# Patient Record
Sex: Female | Born: 1965
Health system: Southern US, Community
[De-identification: ages and names within clinical notes are randomized; demographics above are authoritative.]

## PROBLEM LIST (undated history)

## (undated) DIAGNOSIS — R112 Nausea with vomiting, unspecified: Secondary | ICD-10-CM

## (undated) DIAGNOSIS — F32A Depression, unspecified: Secondary | ICD-10-CM

## (undated) DIAGNOSIS — M5136 Other intervertebral disc degeneration, lumbar region: Secondary | ICD-10-CM

## (undated) DIAGNOSIS — F329 Major depressive disorder, single episode, unspecified: Secondary | ICD-10-CM

## (undated) DIAGNOSIS — I10 Essential (primary) hypertension: Secondary | ICD-10-CM

## (undated) DIAGNOSIS — F419 Anxiety disorder, unspecified: Secondary | ICD-10-CM

## (undated) DIAGNOSIS — G4700 Insomnia, unspecified: Secondary | ICD-10-CM

## (undated) DIAGNOSIS — M51369 Other intervertebral disc degeneration, lumbar region without mention of lumbar back pain or lower extremity pain: Secondary | ICD-10-CM

## (undated) DIAGNOSIS — E785 Hyperlipidemia, unspecified: Secondary | ICD-10-CM

## (undated) DIAGNOSIS — K589 Irritable bowel syndrome without diarrhea: Secondary | ICD-10-CM

## (undated) DIAGNOSIS — K219 Gastro-esophageal reflux disease without esophagitis: Secondary | ICD-10-CM

## (undated) DIAGNOSIS — Z9889 Other specified postprocedural states: Secondary | ICD-10-CM

## (undated) HISTORY — PX: DILATION AND CURETTAGE OF UTERUS: SHX78

## (undated) HISTORY — PX: ABDOMINAL HYSTERECTOMY: SHX81

## (undated) HISTORY — PX: HEMORROIDECTOMY: SUR656

## (undated) HISTORY — PX: COLONOSCOPY: SHX174

## (undated) HISTORY — PX: APPENDECTOMY: SHX54

---

## 1998-05-20 ENCOUNTER — Other Ambulatory Visit: Admission: RE | Admit: 1998-05-20 | Discharge: 1998-05-20 | Payer: Self-pay | Admitting: Obstetrics and Gynecology

## 2000-11-24 ENCOUNTER — Other Ambulatory Visit: Admission: RE | Admit: 2000-11-24 | Discharge: 2000-11-24 | Payer: Self-pay | Admitting: Obstetrics and Gynecology

## 2004-05-14 ENCOUNTER — Other Ambulatory Visit: Admission: RE | Admit: 2004-05-14 | Discharge: 2004-05-14 | Payer: Self-pay | Admitting: Obstetrics and Gynecology

## 2006-03-24 ENCOUNTER — Other Ambulatory Visit: Admission: RE | Admit: 2006-03-24 | Discharge: 2006-03-24 | Payer: Self-pay | Admitting: Obstetrics and Gynecology

## 2006-04-25 ENCOUNTER — Encounter (INDEPENDENT_AMBULATORY_CARE_PROVIDER_SITE_OTHER): Payer: Self-pay | Admitting: *Deleted

## 2006-04-25 ENCOUNTER — Ambulatory Visit (HOSPITAL_COMMUNITY): Admission: RE | Admit: 2006-04-25 | Discharge: 2006-04-26 | Payer: Self-pay | Admitting: Obstetrics and Gynecology

## 2006-12-16 ENCOUNTER — Observation Stay (HOSPITAL_COMMUNITY): Admission: EM | Admit: 2006-12-16 | Discharge: 2006-12-19 | Payer: Self-pay | Admitting: Emergency Medicine

## 2007-03-21 ENCOUNTER — Encounter: Admission: RE | Admit: 2007-03-21 | Discharge: 2007-03-21 | Payer: Self-pay | Admitting: Gastroenterology

## 2007-04-16 ENCOUNTER — Other Ambulatory Visit: Admission: RE | Admit: 2007-04-16 | Discharge: 2007-04-16 | Payer: Self-pay | Admitting: Obstetrics and Gynecology

## 2007-05-14 ENCOUNTER — Ambulatory Visit (HOSPITAL_COMMUNITY): Admission: RE | Admit: 2007-05-14 | Discharge: 2007-05-14 | Payer: Self-pay | Admitting: *Deleted

## 2007-06-04 ENCOUNTER — Encounter: Admission: RE | Admit: 2007-06-04 | Discharge: 2007-06-04 | Payer: Self-pay | Admitting: Gastroenterology

## 2009-01-28 ENCOUNTER — Ambulatory Visit (HOSPITAL_BASED_OUTPATIENT_CLINIC_OR_DEPARTMENT_OTHER): Admission: RE | Admit: 2009-01-28 | Discharge: 2009-01-28 | Payer: Self-pay | Admitting: General Surgery

## 2009-01-28 ENCOUNTER — Encounter (INDEPENDENT_AMBULATORY_CARE_PROVIDER_SITE_OTHER): Payer: Self-pay | Admitting: General Surgery

## 2010-07-26 ENCOUNTER — Encounter: Admission: RE | Admit: 2010-07-26 | Discharge: 2010-07-26 | Payer: Self-pay | Admitting: Gastroenterology

## 2010-08-08 ENCOUNTER — Observation Stay (HOSPITAL_COMMUNITY): Admission: EM | Admit: 2010-08-08 | Discharge: 2010-08-08 | Payer: Self-pay | Admitting: Emergency Medicine

## 2011-02-22 IMAGING — CR DG NECK SOFT TISSUE
1 series · 1 of 1 positions shown · non-contrast
Comparison: None.

CLINICAL DATA: Sore throat, facial swelling

NECK SOFT TISSUES - 1+ VIEW

[w soft tissue neck]
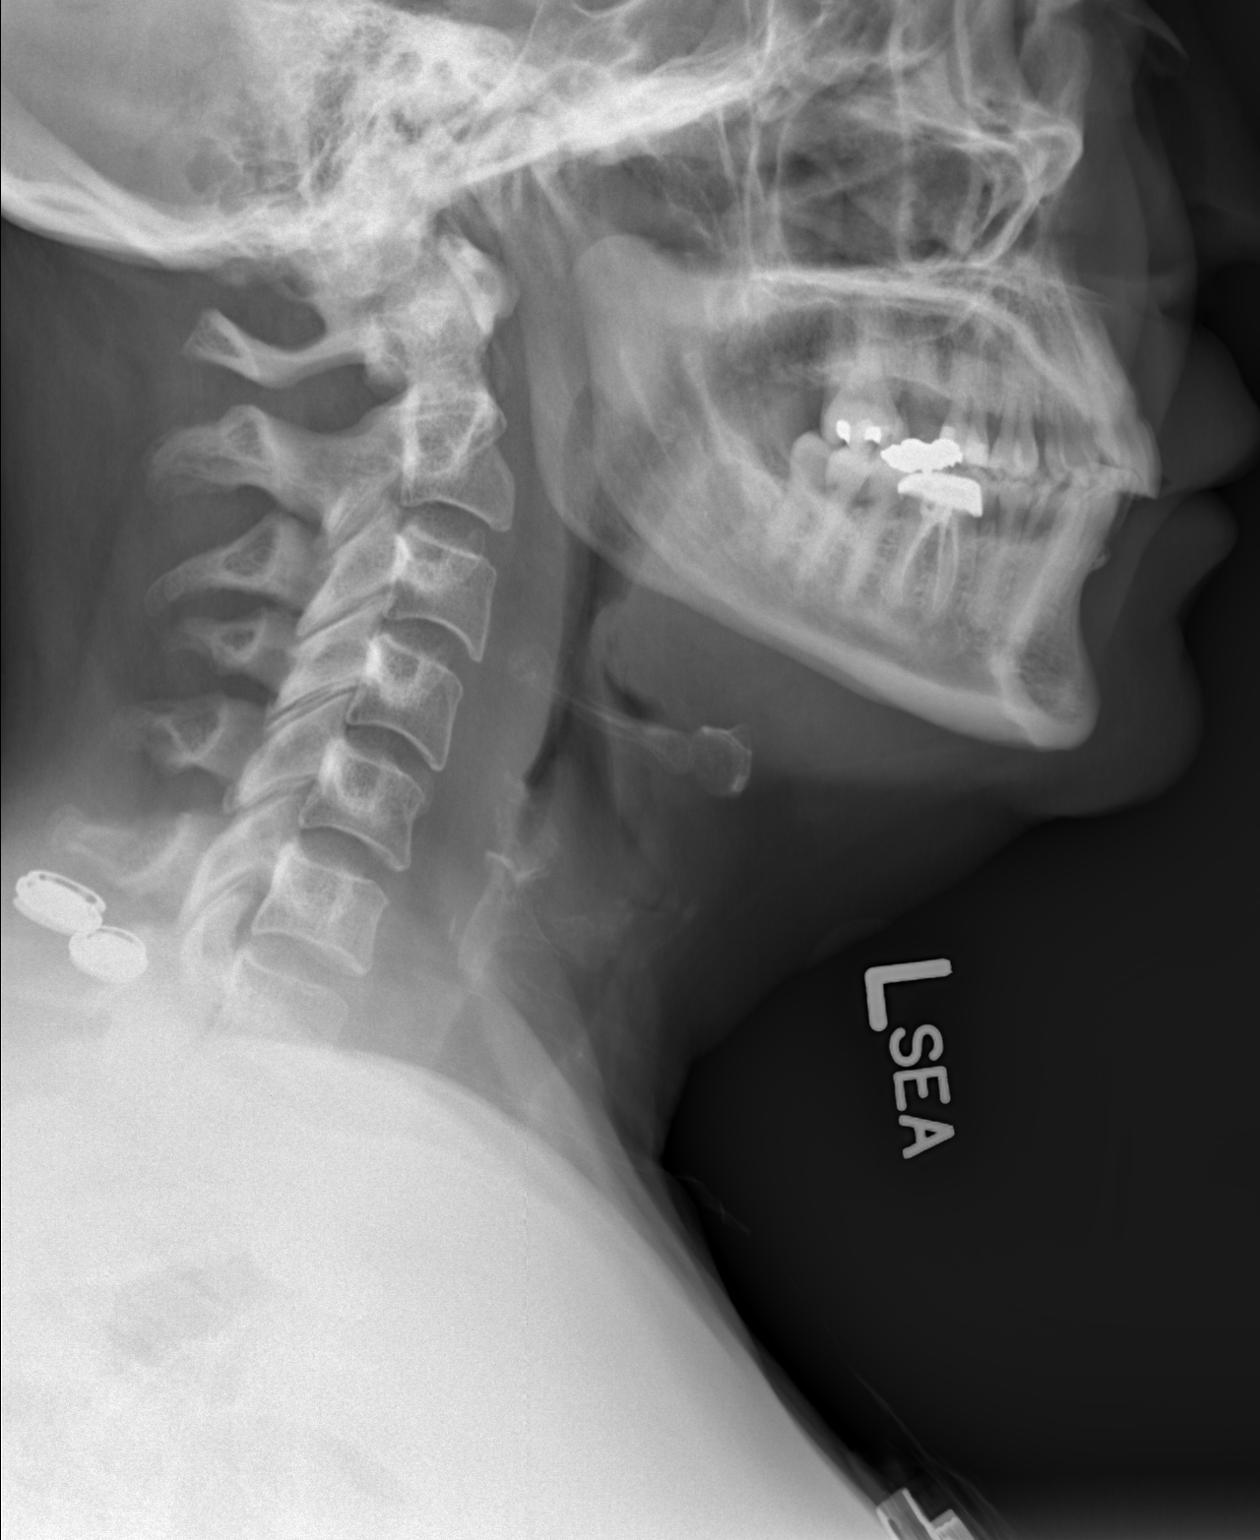

[1 of 1 positions shown; findings below may reference images not displayed]

FINDINGS: Mild thickening of the retropharyngeal soft tissues
measuring 10 mm by plain radiography.  This may related to under
distention of the oropharynx.  Normal appearing epiglottis.  No
retropharyngeal soft tissue air noted.  Cervical spine
unremarkable.
IMPRESSION: Mild retropharyngeal soft tissue thickening by plain radiography.

## 2011-03-22 LAB — DIFFERENTIAL
Basophils Absolute: 0 10*3/uL (ref 0.0–0.1)
Eosinophils Relative: 1 % (ref 0–5)
Lymphs Abs: 2.3 10*3/uL (ref 0.7–4.0)
Monocytes Absolute: 0.6 10*3/uL (ref 0.1–1.0)
Neutrophils Relative %: 70 % (ref 43–77)

## 2011-03-22 LAB — BASIC METABOLIC PANEL
BUN: 15 mg/dL (ref 6–23)
GFR calc Af Amer: >60 mL/min (ref >60)
Glucose, Bld: 97 mg/dL (ref 70–99)
Potassium: 4.3 mEq/L (ref 3.5–5.1)

## 2011-03-22 LAB — CBC
HCT: 38.8 % (ref 36.0–46.0)
Hemoglobin: 13.3 g/dL (ref 12.0–15.0)
RDW: 12.3 % (ref 11.5–15.5)
WBC: 10.2 10*3/uL (ref 4.0–10.5)

## 2011-04-19 NOTE — Op Note (Signed)
NAMECARMEL, Catherine Braun                ACCOUNT NO.:  [id number]   MEDICAL RECORD NO.:  [id number]          PATIENT TYPE:  AMB   LOCATION:  ENDO                         FACILITY:  [facility]   PHYSICIAN:  James L. Malon Kindle., M.D.DATE OF BIRTH:  [birth year]   DATE OF PROCEDURE:  05/14/2007  DATE OF DISCHARGE:  05/14/2007                               OPERATIVE REPORT   PROCEDURE:  Esophageal manometry.   INDICATIONS:  Chest pain of unclear cause with negative cardiac workup,  negative endoscopy, with negative barium swallow showing no stricture or  blockage.  Small hiatal hernia with some reflux.  This is done as an  attempt to determine some other cause to define her continuing pain.   DESCRIPTION OF PROCEDURE:  The procedure was performed the usual manner.  No provocative studies were performed.  The probe was placed and wet  swallows obtained.  The results are as follows:   1. LES:  Seen in the reasonably well although the tracings presented      to be did not show a gross reduction with swallowing.  The computer      analyzed the pressure as 29 mm, which was in the normal range with      an 82% relaxation.  2. Esophageal body:  Normal peristalsis in all wet swallows presented      for evaluation with fairly high pressures in the distal port but      good, normal peristaltic activity.  The mean amplitude was 79 mm      with a duration of 3.7 seconds on the distal esophagus as an      average of all 11 wet swallows.  3. Upper esophageal sphincter was seen fairly well on a couple      swallows and did relax with agglutination.   ASSESSMENT:  Essentially normal esophageal manometry.   PLAN:  Will continue to follow the patient clinically.           ______________________________  Llana Aliment Malon Kindle., M.D.     Waldron Session  D:  05/30/2007  T:  05/30/2007  Job:  [id number]   cc:   Francisca December, M.D.  Malka So, MD  Denese Killings, MD

## 2011-04-19 NOTE — Op Note (Signed)
Catherine Braun, Catherine Braun                ACCOUNT NO.:  [id number]   MEDICAL RECORD NO.:  [id number]          PATIENT TYPE:  AMB   LOCATION:  NESC                         FACILITY:  [facility]   PHYSICIAN:  Anselm Pancoast. Weatherly, M.D.DATE OF BIRTH:  Oct 06, 1966   DATE OF PROCEDURE:  01/28/2009  DATE OF DISCHARGE:                               OPERATIVE REPORT   PREOPERATIVE DIAGNOSIS:  Internal and external hemorrhoids.   POSTOPERATIVE DIAGNOSIS:  Internal and external hemorrhoids.   PROCEDURE:  Internal and external hemorrhoidectomy under general  anesthesia in the lithotomy position.   SURGEON:  Anselm Pancoast. Zachery Dakins, MD.   HISTORY:  Catherine Braun is a 45 year old Caucasian female referred to me  by Dr. Vilinda Boehringer for symptomatic hemorrhoids.  She has irritable bowel  syndrome and has been evaluated by him and is only Amitiza.  She has had  problems with her hemorrhoids, and it appears that her stools are quite  loose, and I saw her in the office approximately 3 or 4 weeks ago and we  discussed whether to try to manage the problems with rubber banding and  kind of office procedures or whether to proceed with a more formal  internal and external hemorrhoidectomy.  She elected to go ahead and  proceed with a formal internal and external hemorrhoidectomy.  In the  office, she has a moderate size hemorrhoid anteriorly with a lot of  redundant tissue.  The largest is on the right lateral, and then the  left lateral is a slightly smaller hemorrhoid.  She is here for the  planned procedure.  She did not take the bottle of Mag Citrate yesterday  afternoon but took it last evening.   She was given 1 g of Ancef and taken to the operative suite and  induction of general anesthesia and endotracheal tube.  The perianal  area was prepped with Betadine surgical scrub and solution, and on  examination you could see the fairly large tags anterior and to the  right side and a smaller one on the left.  I then  after prepping her  with Betadine solution anesthetized the left and right side with 0.25  Marcaine with Adrenaline, and all total about 20 mL was used.  I then  first elevated the anterior hemorrhoid off of the internal sphincter,  removing the external portion, and then under clamping it with a bougie  removed the hemorrhoid and then put some U-stitches of 2-0 chromic first  and then after this I then started at the apex with a continuous 2-0  chromic suture, closing the hemorrhoidal area with a running suture, and  then the external portion I used a couple of interrupted sutures of the  chromic.  Next, the largest hemorrhoid on the right lateral side was  performed.  This hemorrhoid was elevated from the sphincter removing the  fairly large external component and then closing it in the similar  manner with the U-stitches of 2-0 chromic and a running 2-0 chromic.  There was a little external tag between the 2 really on the outside and  I ellipsed that  off and just put some little simple 2-0 chromic sutures  in it.  Next, the one on the left I elevated it off of the sphincter.  There was not a whole lot of external component on that and it was  closed in a similar manner.  All of the suture lines were inspected at  the completion of surgery with no evidence of any active bleeding and  then Nupercaine was placed within the anal canal and an open 4x4 was  used to kind of hopefully hold the medicine in for the next hour or so.  The patient tolerated the procedure, was extubated and said she needed  to void, and then we will make sure she does void before she is  discharged.  I would recommend that we not give her the Amitiza for  approximately 2 to 3 days and use MiraLax if she needs it for a stool  softener and then restart the Amitiza probably about Saturday or Sunday.  I will see her in the office in approximately 7 to 10 days and she will  have Vicodin for pain plus the Xylocaine  ointment.           ______________________________  Anselm Pancoast. Zachery Dakins, M.D.     WJW/MEDQ  D:  01/28/2009  T:  01/28/2009  Job:  [id number]

## 2011-04-22 NOTE — H&P (Signed)
Catherine, Braun NO.:  [id number]   MEDICAL RECORD NO.:  [id number]          PATIENT TYPE:  INP   LOCATION:  4702                         FACILITY:  [facility]   PHYSICIAN:  Della Goo, M.D. DATE OF BIRTH:  1966-08-29   DATE OF ADMISSION:  12/17/2006  DATE OF DISCHARGE:                              HISTORY & PHYSICAL   CHIEF COMPLAINT:  Chest pain   HISTORY OF PRESENT ILLNESS:  This is a 46 year old female who presented  to the emergency department at [facility] with complaints of  substernal chest pain.  She reports having this chest pain  intermittently for 2 days.  The pain is described as being sharp but  also heavy in quality and lasts for minutes each time.  The pain does  radiate into the back.  She denies having any nausea, vomiting,  diarrhea, shortness of breath associated with the symptoms.   PAST MEDICAL HISTORY:  1. Hypertension.  2. Irritable bowel syndrome.   ALLERGIES:  CODEINE.   SOCIAL HISTORY:  Positive tobacco.  Reports being down to half pack per  day.  No alcohol usage.   PAST SURGICAL HISTORY:  1. Total abdominal hysterectomy.  2. Appendectomy.   FAMILY HISTORY:  Father had coronary artery disease and MI at a young  age.   REVIEW OF SYSTEMS:  Negative except for the positives mentioned above  and also positive insomnia.   MEDICATIONS:  1. Hydrochlorothiazide 25 mg one p.o. daily.  2. Effexor XR 150 mg p.o. q.h.s.   PHYSICAL EXAMINATION:  GENERAL:  A 45 year old well-nourished, well-  developed female in no acute distress.  VITAL SIGNS: Temperature 93, blood pressure 157/85, heart rate 78  respirations 16, O2 saturation 99-100%.  HEENT: Normocephalic, atraumatic.  Pupils equally round, reactive to  light.  Extraocular muscles are intact funduscopic benign oropharynx  clear.  NECK: Supple full range of motion of thyromegaly, adenopathy or jugular  venous distension.  CARDIOVASCULAR:  Regular rate and  rhythm.  LUNGS: Clear to auscultation bilaterally.  ABDOMEN:  Positive bowel sounds, soft, nontender, nondistended.  EXTREMITIES:  Without edema.  Neurologic: Alert and oriented.  Nonfocal.  RECTAL/GENITOURINARY:  Deferred   LABORATORY FINDINGS:  Myoglobin 77.7, CK-MB less than 1.0, troponin less  than 0.05.  Sodium 136, potassium 3.1, chloride 104, bicarbonate 28, BUN  15, creatinine 0.9, glucose 109.  Hemoglobin 13.3, hematocrit 39.0, D-  dimer less than 0.22.  EKG findings reveal a normal sinus rhythm and Q-  waves in the inferior leads along with T-wave inversions in the anterior  leads.   ASSESSMENT:  27. A 45 year old female with chest pain being admitted with chest      pain.  Rule out MI.  2. Hypokalemia.  3. Hypertension.  4. Tobacco history.   PLAN:  The patient will be admitted to the telemetry area for cardiac  monitoring.  She will be placed on NitroPaste nasal cannula oxygen.  Beta blocker therapy will be started along with ACE therapy.  The  patient will be placed on Lovenox for DVT prophylaxis.  This will be  changed,  however, if cardiac enzymes begin to return positive.  Smoking  cessation education has also been requested.  The patient will receive  potassium replacement therapy      Della Goo, M.D.  Electronically Signed     HJ/MEDQ  D:  12/18/2006  T:  12/18/2006  Job:  [id number]

## 2011-04-22 NOTE — Op Note (Signed)
NAMESHEREDA, GRAW                ACCOUNT NO.:  [id number]   MEDICAL RECORD NO.:  [id number]          PATIENT TYPE:  INP   LOCATION:  9317                          FACILITY:  [facility]   PHYSICIAN:  Rudy Jew. Ashley Royalty, M.D.DATE OF BIRTH:  02/07/1966   DATE OF PROCEDURE:  04/25/2006  DATE OF DISCHARGE:                                 OPERATIVE REPORT   PREOPERATIVE DIAGNOSES:  1.  Menorrhagia refractory to medical management.  2.  Abnormal uterine bleeding - refractory to medical management.   POSTOPERATIVE DIAGNOSES:  1.  Menorrhagia refractory to medical management.  2.  Abnormal uterine bleeding - refractory to medical management.   PROCEDURE:  Total vaginal hysterectomy, right salpingo-oophorectomy.   SURGEON:  Rudy Jew. Ashley Royalty, MD   ASSISTANT:  Gerald Leitz, MD   ANESTHESIA:  General.   ESTIMATED BLOOD LOSS:  700 mL.   COMPLICATIONS:  None.   PACKS AND DRAINS:  Foley.   COUNTS:  Sponge, needle and instrument count reported as correct x2.   PROCEDURE:  The patient was taken to the operating room and placed in the  dorsal supine position.  After a general anesthetic was administered, she  was placed in the lithotomy position and prepped and draped in the usual  manner for vaginal surgery.  The bladder was drained.  A posterior weighted  retractor was placed per vagina.  Anterior lip of the cervix was grasped  with Christella Hartigan tenaculum.  Approximately 20 mL of 1% Xylocaine with 1:100,000  epinephrine were instilled in the cervix to aid in hemostasis.  The cervix  was then circumscribed with a knife.  A posterior colpotomy incision was  successfully made.  A Steiner-Auvard retractor was placed .  Uterosacral  ligaments were then clamped, cut and secured at #1 chromic catgut.  The  pedicles were held.  The cardinal ligaments were bilaterally clamped, cut  and secured with #1 chromic catgut.  The anterior cul-de-sac was  successfully entered.  The uterine vessels were then  bilaterally clamped,  cut and doubly secured with #1 chromic catgut.  One additional bite along  the parametrium was taken on either side and the pedicle was secured  similarly.  Next, the posterior corpus was delivered with a single-tooth  tenaculum.  The utero-ovarian anastomosis, round ligament and fallopian  tubes were bilaterally clamped, cut and doubly secured #1 chromic catgut and  specimen allowed to deliver.  The specimen was then submitted to Pathology  for histologic studies.  The pedicles were inspected.  A bleeder was  identified proximal to the right adnexal pedicle.  It was difficult to  access with the right adnexa in place.  Hence, the right adnexa was removed  after clamping, cutting and doubly securing the infundibulopelvic ligament.  The bleeding vessel was then better visualized, clamped, cut and secured  with a 2-0 Vicryl interrupted suture.  Hemostasis was noted to be good.  Next, the posterior vaginal cuff was secured with a running-locking suture  from 3 o'clock to 9 o'clock of 2-0 Vicryl.  All pedicles were once again  inspected.  Copious irrigation was accomplished  and hemostasis was noted.  At this point, the patient was felt to have benefited maximally from the  surgical procedure.  The vagina was then closed anteriorly to posteriorly  using interrupted sutures of #1 chromic catgut.  Hemostasis was noted.  At  the conclusion of the procedure, the urine was clear and copious.      James A. Ashley Royalty, M.D.  Electronically Signed     JAM/MEDQ  D:  04/26/2006  T:  04/26/2006  Job:  [id number]

## 2011-04-22 NOTE — Discharge Summary (Signed)
Catherine Braun, Catherine Braun                ACCOUNT NO.:  [id number]   MEDICAL RECORD NO.:  [id number]          PATIENT TYPE:  INP   LOCATION:  4728                         FACILITY:  [facility]   PHYSICIAN:  Beckey Rutter, MD  DATE OF BIRTH:  01/23/1966   DATE OF ADMISSION:  12/16/2006  DATE OF DISCHARGE:                               DISCHARGE SUMMARY   CHIEF COMPLAINT:  Chest pain.   BRIEF HISTORY OF PRESENT ILLNESS:  This is a 45 year old female with  past medical history significant of hypertension and a history of  irritable bowel syndrome came in with chest pain, rule out ACS.  For  full H&P, please refer to the H&P dictated by Dr. Lovell Sheehan on admission  date.   PROCEDURE:  Chest x-ray done on the admission date with the impression  of no evidence of acute cardiopulmonary disease.  Cardiac enzymes,  troponin 0.01 x3.   DISCHARGE DIAGNOSES:  1. Chest pain likely for noncardiogenic reasons.  2. Hypertension.  3. Irritable bowel syndrome.   DISCHARGE MEDICATIONS:  1. Aspirin 325 mg p.o. daily.  2. Metoprolol 25 mg p.o. b.i.d.  3. Lisinopril 10 mg p.o. daily.  4. Effexor 150 mg p.o. daily.  5. Ambien 5 mg p.o. h.s. p.r.n.   HOSPITAL COURSE:  Catherine Braun is a 45 year old female admitted for  chest pain to rule out MI and acute coronary syndrome.  During hospital  stay, patient has been free of chest pain all the time.  No EKG change.  No evidence of MI on the cardiac enzymes.  Patient had a 2-D echo done,  the result is still pending.  Will follow up on that.  The patient was  encouraged to come to collect the result of the 2-D echo.   PLAN:  The plan will be to be discharged, to followup with her primary  physician for further assessment and management.   Note:  Patient was taking hydrochlorothiazide before admission for  hypertension control.  During this hospitalization, this medication was  changed to metoprolol and lisinopril to fit her current suspicions of  coronary  artery disease.  We will discharge the patient on those  medications, to be further followed up and evaluated as needed.      Beckey Rutter, MD  Electronically Signed     EME/MEDQ  D:  12/19/2006  T:  12/20/2006  Job:  [id number]

## 2011-04-22 NOTE — H&P (Signed)
Catherine Braun, Catherine Braun                ACCOUNT NO.:  [id number]   MEDICAL RECORD NO.:  [id number]          PATIENT TYPE:  AMB   LOCATION:  SDC                           FACILITY:  [facility]   PHYSICIAN:  James A. Ashley Royalty, M.D.DATE OF BIRTH:  1966-11-17   DATE OF ADMISSION:  04/25/2006  DATE OF DISCHARGE:                                HISTORY & PHYSICAL   HISTORY:  This is a 45 year old gravida 2, para 2 with a several-year  history of menorrhagia and dysmenorrhea.  Both conditions have been treated  conservatively with hormonal therapy.  The patient recently underwent  ultrasound and sonohysterogram both of which were unremarkable.  She states  a desire for definitive therapy in the form of hysterectomy.   MEDICATION:  Prilosec.   PAST MEDICAL HISTORY:  1.  Medical:      1.  Irritable bowel syndrome.      2.  Variant of multiple sclerosis.      3.  Hiatal hernia.  2.  Surgical:      1.  Status post appendectomy for carcinoid.      2.  Status post D&C hysteroscopy.      3.  Bilateral tubal sterilization procedure.   ALLERGIES:  CODEINE - NAUSEA/VOMITING.  NO RASH.   FAMILY HISTORY:  Noncontributory.   SOCIAL HISTORY:  The patient denies use of tobacco or significant alcohol.   REVIEW OF SYSTEMS:  Noncontributory.   PHYSICAL EXAMINATION:  GENERAL:  Well-developed, well-nourished pleasant  white female no acute distress.  VITAL SIGNS:  Afebrile, vital signs stable.  CHEST:  Lungs are clear.  CARDIAC:  Regular rate and rhythm.  ABDOMEN:  Soft and nontender.  Bowel sounds active.  MUSCULOSKELETAL:  No dependent edema.  PELVIC (PLEASE SEE RECENT OFFICE NOTE):  External genitalia within normal  limits.  Vagina and cervix without gross lesions.  Bimanual examination  revealed uterus to be approximately 8 x 6 x 5 cm and no adnexal masses were  palpable.   IMPRESSION:  1.  Dysmenorrhea and menorrhagia with negative sonohysterogram.  Both are      refractory to medical management.  2.   Irritable bowel syndrome.  3.  Variant of multiple sclerosis.  4.  Hiatal hernia.  5.  Status post appendectomy for carcinoid.   PLAN:  Total vaginal hysterectomy.  Risks, benefits, complications and  alternatives were fully discussed with the patient.  The possibility of  unilateral or bilateral salpingo-oophorectomy discussed and accepted.  The  patient understands in the latter event she would become a candidate for  hormone replacement therapy and agrees to same.  The possibility of total  abdominal hysterectomy discussed and accepted.  Questions invited and  answered.      James A. Ashley Royalty, M.D.  Electronically Signed     JAM/MEDQ  D:  04/24/2006  T:  04/24/2006  Job:  [id number]

## 2011-04-22 NOTE — Discharge Summary (Signed)
NAMECORIANN, Catherine Braun                ACCOUNT NO.:  [id number]   MEDICAL RECORD NO.:  [id number]          PATIENT TYPE:  INP   LOCATION:  9317                          FACILITY:  [facility]   PHYSICIAN:  Rudy Jew. Ashley Royalty, M.D.DATE OF BIRTH:  09/22/1966   DATE OF ADMISSION:  04/25/2006  DATE OF DISCHARGE:  04/26/2006                                 DISCHARGE SUMMARY   DISCHARGE DIAGNOSES:  1.  Dysmenorrhea and menorrhagia refractory to medical management.  2.  Irritable bowel syndrome.  3.  Variant of multiple sclerosis.  4.  Hiatal hernia.  5.  Status post appendectomy for carcinoid.   OPERATIONS/SPECIAL PROCEDURES:  Total vaginal hysterectomy and right  salpingo-oophorectomy.   CONSULTS:  None.   DISCHARGE MEDICATIONS:  1.  Percocet.  2.  Motrin 600 mg.   HISTORY AND PHYSICAL:  This is a 45 year old gravida 2, para 2 with a  several year history of menorrhagia and dysmenorrhea.  Patient had an  ultrasound and sonohysterogram recently both of which were unremarkable.  She has been treated conservatively with hormone therapy without success.  She states a desire for definitive therapy in the form of hysterectomy.  For  the remainder of the history and physical please see chart.   HOSPITAL COURSE:  Patient was admitted to [facility] [place].  Admission laboratory studies were drawn.  She was taken to the operating  room on Apr 25, 2006 and underwent total vaginal hysterectomy along with  right salpingo-oophorectomy.  The procedure was accomplished by Dr. Sylvester Harder without difficulty.  Patient's postoperative course was benign.  She was discharged on Apr 26, 2006 afebrile and in satisfactory condition.   DISPOSITION:  Patient is to return to [facility] in  six weeks for postoperative evaluation.      James A. Ashley Royalty, M.D.  Electronically Signed     JAM/MEDQ  D:  05/25/2006  T:  05/25/2006  Job:  [id number]

## 2012-02-20 ENCOUNTER — Other Ambulatory Visit: Payer: Self-pay | Admitting: Gastroenterology

## 2012-10-31 ENCOUNTER — Other Ambulatory Visit: Payer: Self-pay | Admitting: Orthopedic Surgery

## 2012-11-05 ENCOUNTER — Encounter (HOSPITAL_BASED_OUTPATIENT_CLINIC_OR_DEPARTMENT_OTHER): Payer: Self-pay | Admitting: *Deleted

## 2012-11-05 ENCOUNTER — Encounter (HOSPITAL_BASED_OUTPATIENT_CLINIC_OR_DEPARTMENT_OTHER)
Admission: RE | Admit: 2012-11-05 | Discharge: 2012-11-05 | Disposition: A | Discharge status: Home / Self Care | Payer: BC Managed Care – PPO | Source: Ambulatory Visit | Attending: Orthopedic Surgery | Admitting: Orthopedic Surgery

## 2012-11-05 LAB — BASIC METABOLIC PANEL
BUN: 10 mg/dL (ref 6–23)
Chloride: 101 mEq/L (ref 96–112)
Creatinine, Ser: 0.84 mg/dL (ref 0.50–1.10)
GFR calc Af Amer: >90 mL/min (ref >90)
GFR calc non Af Amer: 81 mL/min — ABNORMAL LOW (ref >90)

## 2012-11-05 NOTE — Progress Notes (Signed)
To come in for bmet-ekg-never seen cardiology-has been taking phentermine

## 2012-11-06 ENCOUNTER — Encounter (HOSPITAL_BASED_OUTPATIENT_CLINIC_OR_DEPARTMENT_OTHER): Payer: Self-pay | Admitting: *Deleted

## 2012-11-06 ENCOUNTER — Encounter (HOSPITAL_BASED_OUTPATIENT_CLINIC_OR_DEPARTMENT_OTHER)
Admission: RE | Disposition: A | Discharge status: Home / Self Care | Payer: Self-pay | Source: Ambulatory Visit | Attending: Orthopedic Surgery

## 2012-11-06 ENCOUNTER — Ambulatory Visit (HOSPITAL_BASED_OUTPATIENT_CLINIC_OR_DEPARTMENT_OTHER)
Admission: RE | Admit: 2012-11-06 | Discharge: 2012-11-06 | Disposition: A | Discharge status: Home / Self Care | Payer: BC Managed Care – PPO | Source: Ambulatory Visit | Attending: Orthopedic Surgery | Admitting: Orthopedic Surgery

## 2012-11-06 ENCOUNTER — Ambulatory Visit (HOSPITAL_BASED_OUTPATIENT_CLINIC_OR_DEPARTMENT_OTHER): Payer: BC Managed Care – PPO | Admitting: *Deleted

## 2012-11-06 DIAGNOSIS — I10 Essential (primary) hypertension: Secondary | ICD-10-CM | POA: Insufficient documentation

## 2012-11-06 DIAGNOSIS — S53409A Unspecified sprain of unspecified elbow, initial encounter: Secondary | ICD-10-CM

## 2012-11-06 DIAGNOSIS — M7710 Lateral epicondylitis, unspecified elbow: Secondary | ICD-10-CM | POA: Insufficient documentation

## 2012-11-06 DIAGNOSIS — S53449A Ulnar collateral ligament sprain of unspecified elbow, initial encounter: Secondary | ICD-10-CM

## 2012-11-06 DIAGNOSIS — Z01812 Encounter for preprocedural laboratory examination: Secondary | ICD-10-CM | POA: Insufficient documentation

## 2012-11-06 HISTORY — DX: Gastro-esophageal reflux disease without esophagitis: K21.9

## 2012-11-06 HISTORY — DX: Irritable bowel syndrome, unspecified: K58.9

## 2012-11-06 HISTORY — DX: Other intervertebral disc degeneration, lumbar region without mention of lumbar back pain or lower extremity pain: M51.369

## 2012-11-06 HISTORY — DX: Insomnia, unspecified: G47.00

## 2012-11-06 HISTORY — DX: Essential (primary) hypertension: I10

## 2012-11-06 HISTORY — DX: Other intervertebral disc degeneration, lumbar region: M51.36

## 2012-11-06 HISTORY — PX: TENDON RECONSTRUCTION: SHX2487

## 2012-11-06 HISTORY — DX: Depression, unspecified: F32.A

## 2012-11-06 HISTORY — DX: Nausea with vomiting, unspecified: R11.2

## 2012-11-06 HISTORY — DX: Hyperlipidemia, unspecified: E78.5

## 2012-11-06 HISTORY — DX: Major depressive disorder, single episode, unspecified: F32.9

## 2012-11-06 HISTORY — DX: Other specified postprocedural states: Z98.890

## 2012-11-06 HISTORY — DX: Anxiety disorder, unspecified: F41.9

## 2012-11-06 LAB — POCT HEMOGLOBIN-HEMACUE: Hemoglobin: 15.2 g/dL — ABNORMAL HIGH (ref 12.0–15.0)

## 2012-11-06 SURGERY — RECONSTRUCTION, TENDON OR LIGAMENT, ELBOW
Anesthesia: Regional | Site: Elbow | Laterality: Left | Wound class: Clean

## 2012-11-06 MED ORDER — POVIDONE-IODINE 7.5 % EX SOLN: Freq: Once | CUTANEOUS | Status: DC

## 2012-11-06 MED ORDER — MIDAZOLAM HCL 2 MG/2ML IJ SOLN
0.5000 mg | INTRAMUSCULAR | Status: DC | PRN
  Administered 2012-11-06: 2 mg via INTRAVENOUS

## 2012-11-06 MED ORDER — DEXAMETHASONE SODIUM PHOSPHATE 4 MG/ML IJ SOLN
INTRAMUSCULAR | Status: DC | PRN
  Administered 2012-11-06: 10 mg via INTRAVENOUS

## 2012-11-06 MED ORDER — SCOPOLAMINE 1 MG/3DAYS TD PT72
1.0000 | MEDICATED_PATCH | TRANSDERMAL | Status: DC
  Administered 2012-11-06: 1.5 mg via TRANSDERMAL

## 2012-11-06 MED ORDER — PROPOFOL 10 MG/ML IV BOLUS
INTRAVENOUS | Status: DC | PRN
  Administered 2012-11-06: 50 mg via INTRAVENOUS
  Administered 2012-11-06: 150 mg via INTRAVENOUS

## 2012-11-06 MED ORDER — OXYCODONE HCL 5 MG PO TABS: 5.0000 mg | ORAL_TABLET | Freq: Once | ORAL | Status: DC | PRN

## 2012-11-06 MED ORDER — SODIUM CHLORIDE 0.9 % IR SOLN
Status: DC | PRN
  Administered 2012-11-06: 1

## 2012-11-06 MED ORDER — ACETAMINOPHEN 10 MG/ML IV SOLN
1000.0000 mg | Freq: Once | INTRAVENOUS | Status: AC
  Administered 2012-11-06: 1000 mg via INTRAVENOUS

## 2012-11-06 MED ORDER — BUPIVACAINE HCL (PF) 0.5 % IJ SOLN
INTRAMUSCULAR | Status: DC | PRN
  Administered 2012-11-06: 10 mL

## 2012-11-06 MED ORDER — BUPIVACAINE-EPINEPHRINE PF 0.5-1:200000 % IJ SOLN
INTRAMUSCULAR | Status: DC | PRN
  Administered 2012-11-06: 30 mL

## 2012-11-06 MED ORDER — FENTANYL CITRATE 0.05 MG/ML IJ SOLN
50.0000 ug | INTRAMUSCULAR | Status: DC | PRN
  Administered 2012-11-06: 100 ug via INTRAVENOUS

## 2012-11-06 MED ORDER — LACTATED RINGERS IV SOLN
INTRAVENOUS | Status: DC
  Administered 2012-11-06 (×2): via INTRAVENOUS

## 2012-11-06 MED ORDER — OXYCODONE HCL 5 MG/5ML PO SOLN: 5.0000 mg | Freq: Once | ORAL | Status: DC | PRN

## 2012-11-06 MED ORDER — ONDANSETRON HCL 4 MG/2ML IJ SOLN
INTRAMUSCULAR | Status: DC | PRN
  Administered 2012-11-06: 4 mg via INTRAVENOUS

## 2012-11-06 MED ORDER — CEFAZOLIN SODIUM-DEXTROSE 2-3 GM-% IV SOLR
2.0000 g | INTRAVENOUS | Status: AC
  Administered 2012-11-06: 2 g via INTRAVENOUS

## 2012-11-06 MED ORDER — HYDROMORPHONE HCL PF 1 MG/ML IJ SOLN: 0.2500 mg | INTRAMUSCULAR | Status: DC | PRN

## 2012-11-06 MED ORDER — OXYCODONE-ACETAMINOPHEN 5-325 MG PO TABS: 1.0000 | ORAL_TABLET | ORAL | Status: DC | PRN

## 2012-11-06 MED ORDER — LIDOCAINE HCL (CARDIAC) 20 MG/ML IV SOLN
INTRAVENOUS | Status: DC | PRN
  Administered 2012-11-06: 20 mg via INTRAVENOUS

## 2012-11-06 SURGICAL SUPPLY — 95 items
ADH SKN CLS APL DERMABOND .7 (GAUZE/BANDAGES/DRESSINGS)
APL SKNCLS STERI-STRIP NONHPOA (GAUZE/BANDAGES/DRESSINGS)
BANDAGE ELASTIC 4 VELCRO ST LF (GAUZE/BANDAGES/DRESSINGS) ×2 IMPLANT
BENZOIN TINCTURE PRP APPL 2/3 (GAUZE/BANDAGES/DRESSINGS) IMPLANT
BIT DRILL 5/64X5 DISP (BIT) ×1 IMPLANT
BLADE 4.2CUDA (BLADE) IMPLANT
BLADE CUDA 5.5 (BLADE) IMPLANT
BLADE CUTTER GATOR 3.5 (BLADE) IMPLANT
BLADE GREAT WHITE 4.2 (BLADE) IMPLANT
BLADE SURG 15 STRL LF DISP TIS (BLADE) IMPLANT
BLADE SURG 15 STRL SS (BLADE) ×4
BLADE SURG ROTATE 9660 (MISCELLANEOUS) IMPLANT
BLADE VORTEX 6.0 (BLADE) IMPLANT
BUR 3.5 LG SPHERICAL (BURR) IMPLANT
BUR FULL RADIUS 2.9 (BURR) IMPLANT
BUR OVAL 4.0 (BURR) ×2 IMPLANT
BUR OVAL 6.0 (BURR) IMPLANT
BURR 3.5 LG SPHERICAL (BURR)
CANISTER OMNI JUG 16 LITER (MISCELLANEOUS) ×1 IMPLANT
CANISTER SUCTION 2500CC (MISCELLANEOUS) IMPLANT
CHLORAPREP W/TINT 26ML (MISCELLANEOUS) ×2 IMPLANT
CLOTH BEACON ORANGE TIMEOUT ST (SAFETY) ×2 IMPLANT
CUFF TOURNIQUET SINGLE 18IN (TOURNIQUET CUFF) ×1 IMPLANT
DECANTER SPIKE VIAL GLASS SM (MISCELLANEOUS) IMPLANT
DERMABOND ADVANCED (GAUZE/BANDAGES/DRESSINGS)
DERMABOND ADVANCED .7 DNX12 (GAUZE/BANDAGES/DRESSINGS) IMPLANT
DRAPE ARTHROSCOPY W/POUCH 90 (DRAPES) ×3 IMPLANT
DRAPE INCISE IOBAN 66X45 STRL (DRAPES) ×2 IMPLANT
DRAPE STERI 35X30 U-POUCH (DRAPES) ×1 IMPLANT
DRAPE SURG 17X23 STRL (DRAPES) ×2 IMPLANT
DRAPE U 20/CS (DRAPES) ×2 IMPLANT
DRAPE U-SHAPE 47X51 STRL (DRAPES) ×2 IMPLANT
DRSG PAD ABDOMINAL 8X10 ST (GAUZE/BANDAGES/DRESSINGS) ×2 IMPLANT
ELECT REM PT RETURN 9FT ADLT (ELECTROSURGICAL) ×2
ELECTRODE REM PT RTRN 9FT ADLT (ELECTROSURGICAL) ×1 IMPLANT
FIBERLOOP 2 0 (SUTURE) ×1 IMPLANT
GAUZE SPONGE 4X4 12PLY STRL LF (GAUZE/BANDAGES/DRESSINGS) ×1 IMPLANT
GAUZE SPONGE 4X4 16PLY XRAY LF (GAUZE/BANDAGES/DRESSINGS) IMPLANT
GAUZE XEROFORM 1X8 LF (GAUZE/BANDAGES/DRESSINGS) ×1 IMPLANT
GLOVE BIO SURGEON STRL SZ7 (GLOVE) ×2 IMPLANT
GLOVE BIO SURGEON STRL SZ7.5 (GLOVE) ×4 IMPLANT
GLOVE BIOGEL PI IND STRL 7.0 (GLOVE) ×1 IMPLANT
GLOVE BIOGEL PI IND STRL 8 (GLOVE) ×2 IMPLANT
GLOVE BIOGEL PI INDICATOR 7.0 (GLOVE) ×1
GLOVE BIOGEL PI INDICATOR 8 (GLOVE) ×2
GOWN PREVENTION PLUS XLARGE (GOWN DISPOSABLE) ×3 IMPLANT
GRAFT TISS 230-320 GRACILIS (Bone Implant) IMPLANT
NS IRRIG 1000ML POUR BTL (IV SOLUTION) ×2 IMPLANT
PACK ARTHROSCOPY DSU (CUSTOM PROCEDURE TRAY) ×2 IMPLANT
PACK BASIN DAY SURGERY FS (CUSTOM PROCEDURE TRAY) ×2 IMPLANT
PAD CAST 4YDX4 CTTN HI CHSV (CAST SUPPLIES) IMPLANT
PADDING CAST COTTON 4X4 STRL (CAST SUPPLIES) ×4
PASSER SUT SWANSON 36MM LOOP (INSTRUMENTS) ×1 IMPLANT
PENCIL BUTTON HOLSTER BLD 10FT (ELECTRODE) ×1 IMPLANT
RESECTOR FULL RADIUS 4.2MM (BLADE) ×1 IMPLANT
RESECTOR FULL RADIUS 4.8MM (BLADE) IMPLANT
SHEET MEDIUM DRAPE 40X70 STRL (DRAPES) IMPLANT
SLEEVE SCD COMPRESS KNEE MED (MISCELLANEOUS) ×2 IMPLANT
SLING ARM FOAM STRAP LRG (SOFTGOODS) ×2 IMPLANT
SLING ARM FOAM STRAP MED (SOFTGOODS) IMPLANT
SLING ARM FOAM STRAP XLG (SOFTGOODS) IMPLANT
SLING ARM IMMOBILIZER MED (SOFTGOODS) IMPLANT
SPLINT FAST PLASTER 5X30 (CAST SUPPLIES) ×10
SPLINT PLASTER CAST FAST 5X30 (CAST SUPPLIES) IMPLANT
SPONGE GAUZE 4X4 12PLY (GAUZE/BANDAGES/DRESSINGS) ×2 IMPLANT
SPONGE LAP 4X18 X RAY DECT (DISPOSABLE) ×1 IMPLANT
STRIP CLOSURE SKIN 1/2X4 (GAUZE/BANDAGES/DRESSINGS) ×1 IMPLANT
SUCTION FRAZIER TIP 10 FR DISP (SUCTIONS) ×1 IMPLANT
SUT 2 FIBERLOOP 20 STRT BLUE (SUTURE) ×4
SUT BONE WAX W31G (SUTURE) IMPLANT
SUT ETHILON 3 0 PS 1 (SUTURE) ×1 IMPLANT
SUT ETHILON 4 0 PS 2 18 (SUTURE) IMPLANT
SUT FIBERWIRE #2 38 T-5 BLUE (SUTURE) ×2
SUT MNCRL AB 3-0 PS2 18 (SUTURE) IMPLANT
SUT MNCRL AB 4-0 PS2 18 (SUTURE) ×1 IMPLANT
SUT PROLENE 3 0 PS 2 (SUTURE) IMPLANT
SUT VIC AB 0 CT1 18XCR BRD 8 (SUTURE) IMPLANT
SUT VIC AB 0 CT1 27 (SUTURE) ×2
SUT VIC AB 0 CT1 27XBRD ANBCTR (SUTURE) IMPLANT
SUT VIC AB 0 CT1 8-18 (SUTURE) ×2
SUT VIC AB 2-0 SH 18 (SUTURE) IMPLANT
SUT VIC AB 2-0 SH 27 (SUTURE) ×2
SUT VIC AB 2-0 SH 27XBRD (SUTURE) IMPLANT
SUTURE 2 FIBERLOOP 20 STRT BLU (SUTURE) IMPLANT
SUTURE FIBERWR #2 38 T-5 BLUE (SUTURE) IMPLANT
SYR BULB 3OZ (MISCELLANEOUS) ×1 IMPLANT
TENDON GRACILIS FROZEN (Bone Implant) ×2 IMPLANT
TENDON GRACILIS FROZEN 230-320 (Bone Implant) ×1 IMPLANT
TOWEL OR 17X24 6PK STRL BLUE (TOWEL DISPOSABLE) ×2 IMPLANT
TOWEL OR NON WOVEN STRL DISP B (DISPOSABLE) ×2 IMPLANT
TUBE CONNECTING 20X1/4 (TUBING) ×2 IMPLANT
TUBING ARTHROSCOPY IRRIG 16FT (MISCELLANEOUS) ×2 IMPLANT
WAND STAR VAC 90 (SURGICAL WAND) ×1 IMPLANT
WATER STERILE IRR 1000ML POUR (IV SOLUTION) ×2 IMPLANT
YANKAUER SUCT BULB TIP NO VENT (SUCTIONS) ×1 IMPLANT

## 2012-11-06 NOTE — H&P (Signed)
Catherine Braun is an 46 y.o. female.   Chief Complaint: L elbow pain HPI: Chronic L elbow lateral epicondylitis with underlying collateral ligament disruption.  Failed conservative treatment.  Past Medical History  Diagnosis Date  . Hypertension   . Anxiety   . Depression   . DDD (degenerative disc disease), lumbar   . IBS (irritable bowel syndrome)   . GERD (gastroesophageal reflux disease)   . Hyperlipemia   . Insomnia   . PONV (postoperative nausea and vomiting)     used a scop patch last surgery-helped    Past Surgical History  Procedure Date  . Appendectomy   . Abdominal hysterectomy   . Dilation and curettage of uterus   . Hemorroidectomy   . Colonoscopy     History reviewed. No pertinent family history. Social History:  reports that she quit smoking about 6 years ago. She does not have any smokeless tobacco history on file. She reports that she drinks alcohol. She reports that she does not use illicit drugs.  Allergies:  Allergies  Allergen Reactions  . Lisinopril Anaphylaxis  . Codeine Nausea And Vomiting    Medications Prior to Admission  Medication Sig Dispense Refill  . ALPRAZolam (XANAX) 0.5 MG tablet Take 0.5 mg by mouth at bedtime as needed.      Marland Kitchen amLODipine (NORVASC) 5 MG tablet Take 5 mg by mouth daily.      Marland Kitchen estradiol (ESTRACE) 2 MG tablet Take 2 mg by mouth daily.      . metoprolol tartrate (LOPRESSOR) 25 MG tablet Take 25 mg by mouth 2 (two) times daily.      Marland Kitchen omeprazole (PRILOSEC) 20 MG capsule Take 20 mg by mouth daily.      . phentermine 37.5 MG capsule Take 37.5 mg by mouth every morning.      . pravastatin (PRAVACHOL) 20 MG tablet Take 20 mg by mouth every evening.      . venlafaxine XR (EFFEXOR-XR) 150 MG 24 hr capsule Take 150 mg by mouth daily.      Marland Kitchen zolpidem (AMBIEN) 10 MG tablet Take 10 mg by mouth at bedtime as needed.        Results for orders placed during the hospital encounter of 11/06/12 (from the past 48 hour(s))  BASIC  METABOLIC PANEL     Status: Abnormal   Collection Time   11/05/12  5:05 PM      Component Value Range Comment   Sodium 138  135 - 145 mEq/L    Potassium 3.5  3.5 - 5.1 mEq/L    Chloride 101  96 - 112 mEq/L    CO2 24  19 - 32 mEq/L    Glucose, Bld 203 (*) 70 - 99 mg/dL    BUN 10  6 - 23 mg/dL    Creatinine, Ser 9.81  0.50 - 1.10 mg/dL    Calcium 9.2  8.4 - 19.1 mg/dL    GFR calc non Af Amer 81 (*) >90 mL/min    GFR calc Af Amer >90  >90 mL/min    No results found.  Review of Systems  All other systems reviewed and are negative.    Blood pressure 152/83, pulse 60, temperature 97.7 F (36.5 C), temperature source Oral, resp. rate 16, height 5\' 2"  (1.575 m), weight 75.807 kg (167 lb 2 oz), SpO2 98.00%. Physical Exam  Constitutional: She is oriented to person, place, and time. She appears well-developed and well-nourished.  HENT:  Head: Atraumatic.  Eyes: EOM  are normal.  Cardiovascular: Intact distal pulses.   Respiratory: Effort normal.  Musculoskeletal:       Left elbow: tenderness found. Lateral epicondyle tenderness noted.  Neurological: She is alert and oriented to person, place, and time.  Skin: Skin is warm and dry.  Psychiatric: She has a normal mood and affect.     Assessment/Plan L chronic lateral epicondylitis with underlying collateral ligament insufficiency Plan lateral collateral ligment reconstruction with debridment lateral epicondyle. Risks / benefits of surgery discussed Consent on chart  NPO for OR Preop antibiotics   Tamasha Laplante WILLIAM 11/06/2012, 10:32 AM

## 2012-11-06 NOTE — Transfer of Care (Signed)
Immediate Anesthesia Transfer of Care Note  Patient: Catherine Braun  Procedure(s) Performed: Procedure(s) (LRB) with comments: ELBOW TENDON RECONSTRUCTION (Left) - Left elbow radial collateral ligament reconstruction   Patient Location: PACU  Anesthesia Type:GA combined with regional for post-op pain  Level of Consciousness: awake, alert  and oriented  Airway & Oxygen Therapy: Patient Spontanous Breathing and Patient connected to face mask oxygen  Post-op Assessment: Report given to PACU RN, Post -op Vital signs reviewed and stable and Patient moving all extremities  Post vital signs: Reviewed and stable  Complications: No apparent anesthesia complications

## 2012-11-06 NOTE — Anesthesia Procedure Notes (Addendum)
Anesthesia Regional Block:  Supraclavicular block  Pre-Anesthetic Checklist: ,, timeout performed, Correct Patient, Correct Site, Correct Laterality, Correct Procedure, Correct Position, site marked, Risks and benefits discussed, pre-op evaluation, post-op pain management  Laterality: Left  Prep: Maximum Sterile Barrier Precautions used and chloraprep       Needles:  Injection technique: Single-shot  Needle Type: Echogenic Stimulator Needle      Needle Gauge: 22 and 22 G    Additional Needles:  Procedures: ultrasound guided (picture in chart) Supraclavicular block Narrative:  Start time: 11/06/2012 10:16 AM End time: 11/06/2012 10:25 AM Injection made incrementally with aspirations every 5 mL. Anesthesiologist: Fitzgerald,MD  Additional Notes: 2% Lidocaine skin wheel. Intercostobrachial block with 5cc of 0.5% Bupivicaine plain.  Supraclavicular block Procedure Name: LMA Insertion Date/Time: 11/06/2012 10:54 AM Performed by: Suann Larry WOLFE Pre-anesthesia Checklist: Patient identified, Emergency Drugs available, Suction available and Patient being monitored Patient Re-evaluated:Patient Re-evaluated prior to inductionOxygen Delivery Method: Circle System Utilized Preoxygenation: Pre-oxygenation with 100% oxygen Intubation Type: IV induction Ventilation: Mask ventilation without difficulty LMA: LMA inserted LMA Size: 4.0 Number of attempts: 1 Airway Equipment and Method: bite block Placement Confirmation: positive ETCO2 and breath sounds checked- equal and bilateral Tube secured with: Tape Dental Injury: Teeth and Oropharynx as per pre-operative assessment

## 2012-11-06 NOTE — Anesthesia Postprocedure Evaluation (Signed)
  Anesthesia Post-op Note  Patient: Catherine Braun  Procedure(s) Performed: Procedure(s) (LRB) with comments: ELBOW TENDON RECONSTRUCTION (Left) - Left elbow radial collateral ligament reconstruction   Patient Location: PACU  Anesthesia Type:GA combined with regional for post-op pain  Level of Consciousness: awake and alert   Airway and Oxygen Therapy: Patient Spontanous Breathing  Post-op Pain: none  Post-op Assessment: Post-op Vital signs reviewed, Patient's Cardiovascular Status Stable, Respiratory Function Stable, Patent Airway and No signs of Nausea or vomiting  Post-op Vital Signs: Reviewed and stable  Complications: No apparent anesthesia complications

## 2012-11-06 NOTE — Anesthesia Preprocedure Evaluation (Addendum)
Anesthesia Evaluation  Patient identified by MRN, date of birth, ID band Patient awake    Reviewed: Allergy & Precautions, H&P , NPO status , Patient's Chart, lab work & pertinent test results, reviewed documented beta blocker date and time   History of Anesthesia Complications (+) PONV  Airway Mallampati: I TM Distance: >3 FB Neck ROM: Full    Dental No notable dental hx. (+) Teeth Intact and Dental Advisory Given   Pulmonary neg pulmonary ROS,  breath sounds clear to auscultation  Pulmonary exam normal       Cardiovascular hypertension, On Medications and On Home Beta Blockers Rhythm:Regular Rate:Normal     Neuro/Psych PSYCHIATRIC DISORDERS negative neurological ROS     GI/Hepatic Neg liver ROS, GERD-  Medicated and Controlled,  Endo/Other  negative endocrine ROS  Renal/GU negative Renal ROS  negative genitourinary   Musculoskeletal   Abdominal   Peds  Hematology negative hematology ROS (+)   Anesthesia Other Findings   Reproductive/Obstetrics negative OB ROS                           Anesthesia Physical Anesthesia Plan  ASA: II  Anesthesia Plan: General and Regional   Post-op Pain Management:    Induction: Intravenous  Airway Management Planned: LMA  Additional Equipment:   Intra-op Plan:   Post-operative Plan: Extubation in OR  Informed Consent: I have reviewed the patients History and Physical, chart, labs and discussed the procedure including the risks, benefits and alternatives for the proposed anesthesia with the patient or authorized representative who has indicated his/her understanding and acceptance.   Dental advisory given  Plan Discussed with: CRNA and Surgeon  Anesthesia Plan Comments:         Anesthesia Quick Evaluation

## 2012-11-06 NOTE — Op Note (Signed)
Procedure(s): ELBOW TENDON RECONSTRUCTION Procedure Note  REBACCA Catherine Braun female 46 y.o. 11/06/2012  Procedure(s) and Anesthesia Type: #1 Left elbow radial collateral ligament reconstruction #2 debridement ECRB origin ( tennis elbow debridement)    Surgeon(s) and Role:    * Mable Paris, MD - Primary   Indications:  46 y.o. female with left refractory lateral epicondylitis. She had 2 previous injections and extensive physical therapy as well as bracing. An MRI was done preoperatively which revealed a complete tear of the humeral origin of the radial collateral ligament. She was indicated for surgical treatment due to her continued discomfort after extensive nonoperative treatment. Given the MRI findings I felt that a standard tennis elbow debridement would inadequately controlled her symptoms and they would likely recur given the underlying collateral ligament injury. Therefore I recommended collateral ligament reconstruction. She understood risks benefits alternatives to the procedure was to go forward.     Surgeon: Mable Paris   Assistants: Damita Lack PA-C [facility] was present and scrubbed throughout the procedure and was essential in positioning, retraction, exposure, and closure)  Anesthesia: General endotracheal anesthesia with preoperative interscalene block    Procedure Detail  Findings: The origin of the ECRB was tendinopathic consistent with chronic tennis elbow. There was also noted to be disruption of the collateral ligament off of the humerus. Therefore the ligament was reconstructed with a gracilic allograft through bone tunnels. Excellent reconstruction of the ligament was noted. The origin of the ECRB was also debrided.  Estimated Blood Loss:  Minimal         Drains: none  Blood Given: none         Specimens: none        Complications:  * No complications entered in OR log *  Tourniquet time: Less than 90 min at 250 mmHg          Disposition: PACU - hemodynamically stable.         Condition: stable    Procedure: The patient was brought to the operating room and was placed supine on the operative table.    A nonsterile tourniquet was applied and the operative extremity was prepped and draped in the standard sterile fashion.  The limb was exsanguinated using an Esmarch dressing and the tourniquet was elevated to 250 mm mercury.  A 8 cm incision was made extending from the lateral condyle distally to the ulnar border of the forearm. Dissection was carried down through subcutaneous tissues to the level of the fascia. The interval between the anconeus and ECU was identified by the fat stripe and perforators. This interval was opened and carefully dissected. This exposed the underlying capsule and proximal ulna. The anconeus was also reflected proximally off of the posterior aspect of the distal humerus. The capsule was initially left intact. The origin of the common extensors was then elevated up off the lateral epicondyle. This exposed the torn radial collateral ligament and the undersurface of the ECRB was noted to be partially torn and tendinopathic.  A rongeur was used here to debride the grayish unhealthy appearing tendon origin back to healthy tendon. Attention was then turned distally to the proximal ulna. Just posterior to the supinator crest to small holes were made in the outer cortex with a 4 mm bur.  these were connected beneath the cortical surface. A 0 Vicryl suture was placed through the tunnel to check isometry and to later pass the graft. The isometric point on the lateral humeral epicondyle was identified and again the  4 mm bur was used to create a small tunnel and a 3 mm bur was then used to create 2 connecting tunnels through the posterior lateral condyle of the humerus. The gracilis graft was then prepared using a #2 fiber loop on one end. The graft was passed through the ulnar tunnels and then the size of the  final graft was estimated by bringing each limb up to the humeral tunnel. A second fiber loop was then used on the remaining limb and the excess graft was cut and discarded. The suture strands from each limb of the graft were then passed through the humeral tunnel and out the posterior humeral tunnels. The arm was held in a pronated position at about 45 of flexion with a slight valgus stress applied. The graft was then tensioned and tied over the posterior bone bridge. The arm was then brought through a range of motion and the graft was noted to be asymmetric and tensioned appropriately. There is no instability. 0 Vicryl was then used to imbricate the graft itself at 2 points to tighten and reinforce the construct. The joint capsule was closed beneath the graft prior to graft placement. The fascia including extensor origin was then carefully repaired using 0 Vicryl suture in a running fashion. Skin was then closed using 2-0 Vicryl in a deep dermal layer and 4-0 Monocryl for skin closure. Steri-Strips were applied as well as a sterile dressing. A well-padded well molded posterior splint was then applied in 90 of elbow flexion in a slightly pronated position.  Tourniquet time was less than 90 minutes at 250 mm mercury.  Postoperative plan: She will remain in her splint until her first followup in approximately 10 days at which point we will move the splint start gentle range of motion exercises in a protective brace.

## 2012-11-07 ENCOUNTER — Encounter (HOSPITAL_BASED_OUTPATIENT_CLINIC_OR_DEPARTMENT_OTHER): Payer: Self-pay | Admitting: Orthopedic Surgery

## 2013-05-09 ENCOUNTER — Encounter (HOSPITAL_BASED_OUTPATIENT_CLINIC_OR_DEPARTMENT_OTHER): Payer: Self-pay | Admitting: *Deleted

## 2013-05-09 NOTE — Progress Notes (Signed)
Pt was here for elbow surgery 12/13-did well with scop patch-no n/v Will need istat-ekg 12/13

## 2013-05-09 NOTE — H&P (Signed)
Catherine Braun is an 47 y.o. female.   CC / Reason for Visit: Left thumb volar mass, left ulnar hand pain and right finger pain and stiffness HPI: Patient is a 47 year old female who presents for evaluation of problems involving both hands.  On the volar aspect of the left thumb is a small mass that is painful particularly when it is pressed upon.  It is right in the center volarly over the IP crease.  She is also developed some pain that started in the ulnar aspect of the wrist and now is in the ulnar border of the hand along the fifth metacarpal shaft dorsal ulnarly.  She denies numbness or tingling.  She has some increasing pain in the right ring finger PIP joint with some osteoarthritic symptoms described  Past Medical History  Diagnosis Date  . Hypertension   . Anxiety   . Depression   . DDD (degenerative disc disease), lumbar   . IBS (irritable bowel syndrome)   . GERD (gastroesophageal reflux disease)   . Hyperlipemia   . Insomnia   . PONV (postoperative nausea and vomiting)     used a scop patch last surgery-helped    Past Surgical History  Procedure Laterality Date  . Appendectomy    . Abdominal hysterectomy    . Dilation and curettage of uterus    . Hemorroidectomy    . Colonoscopy    . Tendon reconstruction  11/06/2012    Procedure: ELBOW TENDON RECONSTRUCTION;  Surgeon: Mable Paris, MD;  Location: [facility];  Service: Orthopedics;  Laterality: Left;  Left elbow radial collateral ligament reconstruction     No family history on file. Social History:  reports that she quit smoking about 6 years ago. She does not have any smokeless tobacco history on file. She reports that  drinks alcohol. She reports that she does not use illicit drugs.  Allergies:  Allergies  Allergen Reactions  . Lisinopril Anaphylaxis  . Codeine Nausea And Vomiting    No prescriptions prior to admission    No results found for this or any previous visit (from the past  48 hour(s)). No results found.  Review of Systems  All other systems reviewed and are negative.    There were no vitals taken for this visit. Physical Exam   Constitutional:  WD, WN, NAD HEENT:  NCAT, EOMI Neuro/Psych:  Alert & oriented to person, place, and time; appropriate mood & affect Lymphatic: No generalized UE edema or lymphadenopathy Extremities / MSK:  Both UE are normal with respect to appearance, ranges of motion, joint stability, muscle strength/tone, sensation, & perfusion except as otherwise noted:  Right ring finger has good motion.  Intact light touch sensation on the left side in both the radial, median, and ulnar nerve distributions.  There is a small mass in the volar aspect of the thumb without any locking or catching.  It is directly over the volar aspect in the midline of the IP crease.  There is tenderness to palpation along the dorsal ulnar expose border of the fifth metacarpal shaft  Labs / Xrays:  No radiographic studies obtained today.  Assessment:  Left thumb mass, and hand pain, and generalized osteoarthritic hand pain  Plan: I discussed these findings with her.  I recommended starting an oral anti-inflammatory medication, and discussed surgical excision of the mass as an excisional biopsy.  She would like to proceed with that promptly.  The details of the operative procedure were discussed with the  patient.  Questions were invited and answered.  In addition to the goal of the procedure, the risks of the procedure to include but not limited to bleeding; infection; damage to the nerves or blood vessels that could result in bleeding, numbness, weakness, chronic pain, and the need for additional procedures; stiffness; the need for revision surgery; and anesthetic risks, the worst of which is death, were reviewed.  No specific outcome was guaranteed or implied.  Informed consent was obtained.  Prescriptions for postoperative analgesia were also written.  A return  appointment 10-15 days postop was not made.  Jaylenn Altier A. 05/09/2013, 3:31 PM

## 2013-05-10 ENCOUNTER — Other Ambulatory Visit: Payer: Self-pay | Admitting: Orthopedic Surgery

## 2013-05-13 ENCOUNTER — Ambulatory Visit (HOSPITAL_BASED_OUTPATIENT_CLINIC_OR_DEPARTMENT_OTHER): Payer: BC Managed Care – PPO | Admitting: Anesthesiology

## 2013-05-13 ENCOUNTER — Encounter (HOSPITAL_BASED_OUTPATIENT_CLINIC_OR_DEPARTMENT_OTHER): Payer: Self-pay | Admitting: Anesthesiology

## 2013-05-13 ENCOUNTER — Encounter (HOSPITAL_BASED_OUTPATIENT_CLINIC_OR_DEPARTMENT_OTHER)
Admission: RE | Disposition: A | Discharge status: Home / Self Care | Payer: Self-pay | Source: Ambulatory Visit | Attending: Orthopedic Surgery

## 2013-05-13 ENCOUNTER — Ambulatory Visit (HOSPITAL_BASED_OUTPATIENT_CLINIC_OR_DEPARTMENT_OTHER)
Admission: RE | Admit: 2013-05-13 | Discharge: 2013-05-13 | Disposition: A | Discharge status: Home / Self Care | Payer: BC Managed Care – PPO | Source: Ambulatory Visit | Attending: Orthopedic Surgery | Admitting: Orthopedic Surgery

## 2013-05-13 ENCOUNTER — Encounter (HOSPITAL_BASED_OUTPATIENT_CLINIC_OR_DEPARTMENT_OTHER): Payer: Self-pay | Admitting: *Deleted

## 2013-05-13 DIAGNOSIS — I10 Essential (primary) hypertension: Secondary | ICD-10-CM | POA: Insufficient documentation

## 2013-05-13 DIAGNOSIS — Z87891 Personal history of nicotine dependence: Secondary | ICD-10-CM | POA: Insufficient documentation

## 2013-05-13 DIAGNOSIS — Z79899 Other long term (current) drug therapy: Secondary | ICD-10-CM | POA: Insufficient documentation

## 2013-05-13 DIAGNOSIS — F411 Generalized anxiety disorder: Secondary | ICD-10-CM | POA: Insufficient documentation

## 2013-05-13 DIAGNOSIS — K589 Irritable bowel syndrome without diarrhea: Secondary | ICD-10-CM | POA: Insufficient documentation

## 2013-05-13 DIAGNOSIS — Z885 Allergy status to narcotic agent status: Secondary | ICD-10-CM | POA: Insufficient documentation

## 2013-05-13 DIAGNOSIS — M259 Joint disorder, unspecified: Secondary | ICD-10-CM | POA: Insufficient documentation

## 2013-05-13 DIAGNOSIS — F3289 Other specified depressive episodes: Secondary | ICD-10-CM | POA: Insufficient documentation

## 2013-05-13 DIAGNOSIS — K219 Gastro-esophageal reflux disease without esophagitis: Secondary | ICD-10-CM | POA: Insufficient documentation

## 2013-05-13 DIAGNOSIS — E785 Hyperlipidemia, unspecified: Secondary | ICD-10-CM | POA: Insufficient documentation

## 2013-05-13 DIAGNOSIS — M5137 Other intervertebral disc degeneration, lumbosacral region: Secondary | ICD-10-CM | POA: Insufficient documentation

## 2013-05-13 DIAGNOSIS — Z888 Allergy status to other drugs, medicaments and biological substances status: Secondary | ICD-10-CM | POA: Insufficient documentation

## 2013-05-13 DIAGNOSIS — M19049 Primary osteoarthritis, unspecified hand: Secondary | ICD-10-CM | POA: Insufficient documentation

## 2013-05-13 DIAGNOSIS — M51379 Other intervertebral disc degeneration, lumbosacral region without mention of lumbar back pain or lower extremity pain: Secondary | ICD-10-CM | POA: Insufficient documentation

## 2013-05-13 DIAGNOSIS — F329 Major depressive disorder, single episode, unspecified: Secondary | ICD-10-CM | POA: Insufficient documentation

## 2013-05-13 DIAGNOSIS — G4700 Insomnia, unspecified: Secondary | ICD-10-CM | POA: Insufficient documentation

## 2013-05-13 HISTORY — PX: MASS EXCISION: SHX2000

## 2013-05-13 LAB — POCT I-STAT, CHEM 8
Calcium, Ion: 1.06 mmol/L — ABNORMAL LOW (ref 1.12–1.23)
Chloride: 106 mEq/L (ref 96–112)
Glucose, Bld: 109 mg/dL — ABNORMAL HIGH (ref 70–99)
HCT: 38 % (ref 36.0–46.0)
Hemoglobin: 12.9 g/dL (ref 12.0–15.0)
TCO2: 25 mmol/L (ref 0–100)

## 2013-05-13 SURGERY — EXCISION MASS
Anesthesia: Monitor Anesthesia Care | Site: Thumb | Laterality: Left | Wound class: Clean

## 2013-05-13 MED ORDER — MIDAZOLAM HCL 5 MG/5ML IJ SOLN
INTRAMUSCULAR | Status: DC | PRN
  Administered 2013-05-13: 2 mg via INTRAVENOUS

## 2013-05-13 MED ORDER — OXYCODONE HCL 5 MG PO TABS: 5.0000 mg | ORAL_TABLET | Freq: Once | ORAL | Status: DC | PRN

## 2013-05-13 MED ORDER — BUPIVACAINE HCL (PF) 0.25 % IJ SOLN
INTRAMUSCULAR | Status: DC | PRN
  Administered 2013-05-13: 3 mL

## 2013-05-13 MED ORDER — ONDANSETRON HCL 4 MG/2ML IJ SOLN
INTRAMUSCULAR | Status: DC | PRN
  Administered 2013-05-13: 4 mg via INTRAVENOUS

## 2013-05-13 MED ORDER — FENTANYL CITRATE 0.05 MG/ML IJ SOLN
INTRAMUSCULAR | Status: DC | PRN
  Administered 2013-05-13: 50 ug via INTRAVENOUS

## 2013-05-13 MED ORDER — HYDROMORPHONE HCL PF 1 MG/ML IJ SOLN: 0.5000 mg | INTRAMUSCULAR | Status: DC | PRN

## 2013-05-13 MED ORDER — HYDROMORPHONE HCL PF 1 MG/ML IJ SOLN
0.2500 mg | INTRAMUSCULAR | Status: DC | PRN
Start: 2013-05-13 — End: 2013-05-13

## 2013-05-13 MED ORDER — OXYCODONE HCL 5 MG/5ML PO SOLN: 5.0000 mg | Freq: Once | ORAL | Status: DC | PRN

## 2013-05-13 MED ORDER — ONDANSETRON HCL 4 MG/2ML IJ SOLN: 4.0000 mg | Freq: Once | INTRAMUSCULAR | Status: DC | PRN

## 2013-05-13 MED ORDER — CEFAZOLIN SODIUM-DEXTROSE 2-3 GM-% IV SOLR: 2.0000 g | INTRAVENOUS | Status: DC

## 2013-05-13 MED ORDER — PROPOFOL INFUSION 10 MG/ML OPTIME
INTRAVENOUS | Status: DC | PRN
  Administered 2013-05-13: 100 ug/kg/min via INTRAVENOUS

## 2013-05-13 MED ORDER — HYDROCODONE-ACETAMINOPHEN 5-325 MG PO TABS: 1.0000 | ORAL_TABLET | ORAL | Status: DC | PRN

## 2013-05-13 MED ORDER — LIDOCAINE HCL (PF) 1 % IJ SOLN
INTRAMUSCULAR | Status: DC | PRN
  Administered 2013-05-13: 3 mL

## 2013-05-13 MED ORDER — LIDOCAINE HCL (CARDIAC) 20 MG/ML IV SOLN
INTRAVENOUS | Status: DC | PRN
  Administered 2013-05-13: 50 mg via INTRAVENOUS

## 2013-05-13 MED ORDER — OXYCODONE-ACETAMINOPHEN 5-325 MG PO TABS: 1.0000 | ORAL_TABLET | ORAL | Status: DC | PRN

## 2013-05-13 MED ORDER — LACTATED RINGERS IV SOLN: INTRAVENOUS | Status: DC

## 2013-05-13 MED ORDER — CHLORHEXIDINE GLUCONATE 4 % EX LIQD: 60.0000 mL | Freq: Once | CUTANEOUS | Status: DC

## 2013-05-13 MED ORDER — CEFAZOLIN SODIUM-DEXTROSE 2-3 GM-% IV SOLR
INTRAVENOUS | Status: DC | PRN
  Administered 2013-05-13: 2 g via INTRAVENOUS

## 2013-05-13 MED ORDER — LACTATED RINGERS IV SOLN
INTRAVENOUS | Status: DC | PRN
  Administered 2013-05-13: 08:00:00 via INTRAVENOUS

## 2013-05-13 MED ORDER — LACTATED RINGERS IV SOLN
INTRAVENOUS | Status: DC
  Administered 2013-05-13: 07:00:00 via INTRAVENOUS

## 2013-05-13 SURGICAL SUPPLY — 42 items
BANDAGE COBAN STERILE 2 (GAUZE/BANDAGES/DRESSINGS) IMPLANT
BANDAGE GAUZE ELAST BULKY 4 IN (GAUZE/BANDAGES/DRESSINGS) ×4 IMPLANT
BANDAGE GAUZE STRT 1 STR LF (GAUZE/BANDAGES/DRESSINGS) IMPLANT
BLADE MINI RND TIP GREEN BEAV (BLADE) IMPLANT
BLADE SURG 15 STRL LF DISP TIS (BLADE) ×1 IMPLANT
BLADE SURG 15 STRL SS (BLADE) ×2
BNDG CMPR 9X4 STRL LF SNTH (GAUZE/BANDAGES/DRESSINGS)
BNDG COHESIVE 4X5 TAN STRL (GAUZE/BANDAGES/DRESSINGS) ×2 IMPLANT
BNDG ESMARK 4X9 LF (GAUZE/BANDAGES/DRESSINGS) IMPLANT
CHLORAPREP W/TINT 26ML (MISCELLANEOUS) ×2 IMPLANT
CORDS BIPOLAR (ELECTRODE) IMPLANT
COVER MAYO STAND STRL (DRAPES) ×2 IMPLANT
COVER TABLE BACK 60X90 (DRAPES) ×2 IMPLANT
CUFF TOURNIQUET SINGLE 18IN (TOURNIQUET CUFF) IMPLANT
DRAIN PENROSE 1/2X12 LTX STRL (WOUND CARE) IMPLANT
DRAPE EXTREMITY T 121X128X90 (DRAPE) ×2 IMPLANT
DRAPE SURG 17X23 STRL (DRAPES) ×2 IMPLANT
DRSG EMULSION OIL 3X3 NADH (GAUZE/BANDAGES/DRESSINGS) ×2 IMPLANT
GLOVE BIO SURGEON STRL SZ 6.5 (GLOVE) ×1 IMPLANT
GLOVE BIO SURGEON STRL SZ7.5 (GLOVE) ×2 IMPLANT
GLOVE BIOGEL PI IND STRL 7.0 (GLOVE) IMPLANT
GLOVE BIOGEL PI IND STRL 8 (GLOVE) ×1 IMPLANT
GLOVE BIOGEL PI INDICATOR 7.0 (GLOVE) ×1
GLOVE BIOGEL PI INDICATOR 8 (GLOVE) ×1
GOWN PREVENTION PLUS XLARGE (GOWN DISPOSABLE) ×2 IMPLANT
NDL HYPO 25X1 1.5 SAFETY (NEEDLE) IMPLANT
NEEDLE HYPO 25X1 1.5 SAFETY (NEEDLE) IMPLANT
NS IRRIG 1000ML POUR BTL (IV SOLUTION) ×2 IMPLANT
PACK BASIN DAY SURGERY FS (CUSTOM PROCEDURE TRAY) ×2 IMPLANT
PAD CAST 4YDX4 CTTN HI CHSV (CAST SUPPLIES) ×1 IMPLANT
PADDING CAST ABS 4INX4YD NS (CAST SUPPLIES)
PADDING CAST ABS COTTON 4X4 ST (CAST SUPPLIES) IMPLANT
PADDING CAST COTTON 4X4 STRL (CAST SUPPLIES) ×2
RUBBERBAND STERILE (MISCELLANEOUS) IMPLANT
SPONGE GAUZE 4X4 12PLY (GAUZE/BANDAGES/DRESSINGS) ×2 IMPLANT
STOCKINETTE 4X48 STRL (DRAPES) ×2 IMPLANT
SUT VICRYL RAPIDE 4/0 PS 2 (SUTURE) IMPLANT
SYR BULB 3OZ (MISCELLANEOUS) ×2 IMPLANT
SYRINGE 10CC LL (SYRINGE) IMPLANT
TOWEL OR 17X24 6PK STRL BLUE (TOWEL DISPOSABLE) ×2 IMPLANT
TOWEL OR NON WOVEN STRL DISP B (DISPOSABLE) ×2 IMPLANT
UNDERPAD 30X30 INCONTINENT (UNDERPADS AND DIAPERS) ×2 IMPLANT

## 2013-05-13 NOTE — Anesthesia Preprocedure Evaluation (Signed)
Anesthesia Evaluation  Patient identified by MRN, date of birth, ID band Patient awake    Reviewed: Allergy & Precautions, H&P , NPO status , Patient's Chart, lab work & pertinent test results, reviewed documented beta blocker date and time   History of Anesthesia Complications (+) PONV  Airway Mallampati: I TM Distance: >3 FB Neck ROM: Full    Dental  (+) Teeth Intact and Dental Advisory Given   Pulmonary  breath sounds clear to auscultation        Cardiovascular hypertension, Pt. on medications and Pt. on home beta blockers Rhythm:Regular Rate:Normal     Neuro/Psych    GI/Hepatic GERD-  Medicated,  Endo/Other    Renal/GU      Musculoskeletal   Abdominal   Peds  Hematology   Anesthesia Other Findings   Reproductive/Obstetrics                           Anesthesia Physical Anesthesia Plan  ASA: II  Anesthesia Plan: MAC   Post-op Pain Management:    Induction: Intravenous  Airway Management Planned: Simple Face Mask  Additional Equipment:   Intra-op Plan:   Post-operative Plan:   Informed Consent: I have reviewed the patients History and Physical, chart, labs and discussed the procedure including the risks, benefits and alternatives for the proposed anesthesia with the patient or authorized representative who has indicated his/her understanding and acceptance.   Dental advisory given  Plan Discussed with: CRNA, Anesthesiologist and Surgeon  Anesthesia Plan Comments:         Anesthesia Quick Evaluation

## 2013-05-13 NOTE — Op Note (Signed)
05/13/2013  8:17 AM  PATIENT:  Catherine Braun  47 y.o. female  PRE-OPERATIVE DIAGNOSIS:  Painful enlargement volar left thumb  POST-OPERATIVE DIAGNOSIS:  Painful accessory ossicle or sesamoid, left thumb IP joint  PROCEDURE:  Excision of accessory ossicle left thumb IP joint  SURGEON: Cliffton Asters. Janee Morn, MD  PHYSICIAN ASSISTANT: None  ANESTHESIA:  local and MAC  SPECIMENS:  Ossicle sent to pathology  DRAINS:   None  PREOPERATIVE INDICATIONS:  Catherine Braun is a  47 y.o. female who presented with what she described as a slowly enlarging prominence on the volar aspect of the left thumb at the IP joint. Aspiration failed to reveal it to be a ganglion. Discussion was held with her regarding the options of imaging versus aspiration or simply exploration and excisional biopsy and she consented to proceed with excision.   The risks benefits and alternatives were discussed with the patient preoperatively including but not limited to the risks of infection, bleeding, nerve injury, cardiopulmonary complications, the need for revision surgery, among others, and the patient verbalized understanding and consented to proceed.  OPERATIVE IMPLANTS: None  OPERATIVE FINDINGS: Accessory ossicle deep to the palmar plate in the central aspect. Ossicle lacked any articular cartilage.  OPERATIVE PROCEDURE:  After receiving prophylactic antibiotics, the patient was escorted to the operative theatre and placed in a supine position.  A surgical "time-out" was performed during which the planned procedure, proposed operative site, and the correct patient identity were compared to the operative consent and agreement confirmed by the circulating nurse according to current facility policy.  Digital block consisting of lidocaine and Marcaine bearing epinephrine was instilled. Following application of a tourniquet to the operative extremity, the exposed skin was prepped with Chloraprep and draped in the usual sterile  fashion.  The limb was exsanguinated with an Esmarch bandage and the tourniquet inflated to approximately higher than systolic BP.  A 2 limb zigzag incision was created over the volar aspect of the thumb IP joint. The skin itself was adherent to the flexor sheath rate in the midline. This was freed with scissor dissection. Care was taken to protect and preserve the radial and ulnar neurovascular structures. There was a prominence within the flexor tendon volarly. The sheath was released longitudinally in the midline over the distal aspect of the tendon and the tendon retracted to reveal the prominence to be within the deep portion of the sheath. The soft tissue portion of the sheath was split longitudinally in the midline and reflected radially and ulnarly off of the ossicle which was beneath it. In this manner the ossicle was excised. It was examined and found to be barren of any articular cartilage.  The wound was irrigated and the sheath was reapproximated with 4-0 Vicryl Rapide suture. The tendon was returned to the bed and the outer layer of sheath repaired over it with the same suture. The wound is irrigated, additional hemostasis obtained after the tourniquet was released, and the skin was closed with 4-0 Vicryl Rapide interrupted sutures. A soft thumb dressing was applied. She was taken to the recovery room in stable condition  DISPOSITION: She'll be discharged home today with typical postop instructions returning in 10-15 days for reassessment.

## 2013-05-13 NOTE — Interval H&P Note (Signed)
History and Physical Interval Note:  05/13/2013 8:22 AM  Catherine Braun  has presented today for surgery, with the diagnosis of Left Thumb volar Mass  The various methods of treatment have been discussed with the patient and family. After consideration of risks, benefits and other options for treatment, the patient has consented to  Procedure(s): EXCISION MASS LEFT THUMB (Left) as a surgical intervention .  The patient's history has been reviewed, patient examined, no change in status, stable for surgery.  I have reviewed the patient's chart and labs.  Questions were answered to the patient's satisfaction.     Kyshawn Teal A.

## 2013-05-13 NOTE — Transfer of Care (Signed)
Immediate Anesthesia Transfer of Care Note  Patient: Catherine Braun  Procedure(s) Performed: Procedure(s): EXCISION MASS LEFT THUMB (Left)  Patient Location: PACU  Anesthesia Type:MAC  Level of Consciousness: awake, oriented and patient cooperative  Airway & Oxygen Therapy: Patient Spontanous Breathing and Patient connected to face mask oxygen  Post-op Assessment: Report given to PACU RN and Post -op Vital signs reviewed and stable  Post vital signs: Reviewed and stable  Complications: No apparent anesthesia complications

## 2013-05-13 NOTE — Anesthesia Postprocedure Evaluation (Signed)
  Anesthesia Post-op Note  Patient: Catherine Braun  Procedure(s) Performed: Procedure(s): EXCISION MASS LEFT THUMB (Left)  Patient Location: PACU  Anesthesia Type:MAC  Level of Consciousness: awake, alert  and oriented  Airway and Oxygen Therapy: Patient Spontanous Breathing  Post-op Pain: none  Post-op Assessment: Post-op Vital signs reviewed  Post-op Vital Signs: Reviewed  Complications: No apparent anesthesia complications

## 2013-05-14 ENCOUNTER — Encounter (HOSPITAL_BASED_OUTPATIENT_CLINIC_OR_DEPARTMENT_OTHER): Payer: Self-pay | Admitting: Orthopedic Surgery

## 2014-10-27 ENCOUNTER — Other Ambulatory Visit: Payer: Self-pay | Admitting: Obstetrics and Gynecology

## 2014-10-31 LAB — CYTOLOGY - PAP

## 2019-02-28 MED FILL — ESTRADIOL 2 MG TABS: 2 | 30 days supply | Qty: 30 | Fill #0

## 2019-03-01 MED FILL — ZOLPIDEM TARTRATE 10 MG TAB: 10 | 30 days supply | Qty: 30 | Fill #0

## 2019-03-01 MED FILL — ALPRAZolam 0.5 MG TABS: 0.5 | 30 days supply | Qty: 30 | Fill #0

## 2019-03-15 MED FILL — TRULANCE 3 MG TABLET: 3 | 30 days supply | Qty: 30 | Fill #0

## 2019-03-28 MED FILL — ALPRAZolam 0.5 MG TABS: 0.5 | 30 days supply | Qty: 30 | Fill #0

## 2019-03-28 MED FILL — ESTRADIOL 2 MG TABS: 2 | 30 days supply | Qty: 30 | Fill #1

## 2019-03-28 MED FILL — ZOLPIDEM TARTRATE 10 MG TAB: 10 | 30 days supply | Qty: 30 | Fill #0

## 2019-04-12 MED FILL — TRULANCE 3 MG TABLET: 3 | 30 days supply | Qty: 30 | Fill #0

## 2019-04-25 MED FILL — ZOLPIDEM TARTRATE 10 MG TAB: 10 | 30 days supply | Qty: 30 | Fill #0

## 2019-04-25 MED FILL — ESTRADIOL 2 MG TABS: 2 | 30 days supply | Qty: 30 | Fill #2

## 2019-04-25 MED FILL — ALPRAZolam 0.5 MG TABS: 0.5 | 30 days supply | Qty: 30 | Fill #0

## 2019-05-14 MED FILL — TRULANCE 3 MG TABLET: 3 | 30 days supply | Qty: 30 | Fill #1

## 2019-05-24 MED FILL — ESTRADIOL 2 MG TABS: 2 | 30 days supply | Qty: 30 | Fill #3

## 2019-05-29 MED FILL — ALPRAZolam 0.5 MG TABS: 0.5 | 30 days supply | Qty: 30 | Fill #0

## 2019-05-29 MED FILL — ZOLPIDEM TARTRATE 10 MG TAB: 10 | 30 days supply | Qty: 30 | Fill #0

## 2019-05-31 MED FILL — MELOXICAM 15 MG TABLET: 15 | 30 days supply | Qty: 30 | Fill #0

## 2020-01-24 ENCOUNTER — Other Ambulatory Visit (HOSPITAL_COMMUNITY): Payer: Self-pay | Admitting: Obstetrics and Gynecology

## 2020-02-17 ENCOUNTER — Ambulatory Visit: Payer: Self-pay | Attending: Internal Medicine

## 2020-02-17 DIAGNOSIS — Z23 Encounter for immunization: Secondary | ICD-10-CM

## 2020-02-17 NOTE — Progress Notes (Signed)
   Covid-19 Vaccination Clinic  Name:  Catherine Braun    MRN: [id number] DOB: [birth year]  02/17/2020  Catherine Braun was observed post Covid-19 immunization for 15 minutes without incident. She was provided with Vaccine Information Sheet and instruction to access the V-Safe system.   Catherine Braun was instructed to call 911 with any severe reactions post vaccine: Marland Kitchen Difficulty breathing  . Swelling of face and throat  . A fast heartbeat  . A bad rash all over body  . Dizziness and weakness   Immunizations Administered    Name Date Dose VIS Date Route   Pfizer COVID-19 Vaccine 02/17/2020  3:32 PM 0.3 mL 11/15/2019 Intramuscular   Manufacturer: [employer], [employer]   Lot: [id number]   NDC: [address]-1

## 2020-03-11 ENCOUNTER — Ambulatory Visit: Payer: Self-pay | Attending: Internal Medicine

## 2020-03-11 DIAGNOSIS — Z23 Encounter for immunization: Secondary | ICD-10-CM

## 2020-03-11 NOTE — Progress Notes (Signed)
   Covid-19 Vaccination Clinic  Name:  DARLEN GLEDHILL    MRN: [id number] DOB: 06/08/1966  03/11/2020  Ms. Taaffe was observed post Covid-19 immunization for 15 minutes without incident. She was provided with Vaccine Information Sheet and instruction to access the V-Safe system.   Ms. Funnell was instructed to call 911 with any severe reactions post vaccine: Marland Kitchen Difficulty breathing  . Swelling of face and throat  . A fast heartbeat  . A bad rash all over body  . Dizziness and weakness   Immunizations Administered    Name Date Dose VIS Date Route   Pfizer COVID-19 Vaccine 03/11/2020  1:31 PM 0.3 mL 11/15/2019 Intramuscular   Manufacturer: [employer], [employer]   Lot: [id number]   NDC: [address]-4

## 2020-07-25 ENCOUNTER — Other Ambulatory Visit (HOSPITAL_COMMUNITY): Payer: Self-pay | Admitting: Obstetrics and Gynecology

## 2020-09-22 ENCOUNTER — Other Ambulatory Visit (HOSPITAL_COMMUNITY): Payer: Self-pay | Admitting: Obstetrics and Gynecology

## 2020-09-30 ENCOUNTER — Other Ambulatory Visit (HOSPITAL_COMMUNITY): Payer: Self-pay | Admitting: Obstetrics and Gynecology

## 2020-10-22 ENCOUNTER — Other Ambulatory Visit (HOSPITAL_COMMUNITY): Payer: Self-pay | Admitting: Obstetrics and Gynecology

## 2020-11-11 ENCOUNTER — Other Ambulatory Visit (HOSPITAL_COMMUNITY): Payer: Self-pay | Admitting: Internal Medicine

## 2020-11-18 ENCOUNTER — Ambulatory Visit: Payer: Self-pay | Attending: Internal Medicine

## 2020-11-18 DIAGNOSIS — Z23 Encounter for immunization: Secondary | ICD-10-CM

## 2020-11-18 NOTE — Progress Notes (Signed)
   Covid-19 Vaccination Clinic  Name:  Catherine Braun    MRN: [id number] DOB: [birth year]  11/18/2020  Catherine Braun was observed post Covid-19 immunization for 15 minutes without incident. She was provided with Vaccine Information Sheet and instruction to access the V-Safe system.   Catherine Braun was instructed to call 911 with any severe reactions post vaccine: Marland Kitchen Difficulty breathing  . Swelling of face and throat  . A fast heartbeat  . A bad rash all over body  . Dizziness and weakness   Immunizations Administered    Name Date Dose VIS Date Route   Pfizer COVID-19 Vaccine 11/18/2020  9:32 AM 0.3 mL 09/23/2020 Intramuscular   Manufacturer: [employer], [employer]   Lot: [id number]   NDC: [address]-6

## 2020-11-20 ENCOUNTER — Other Ambulatory Visit (HOSPITAL_COMMUNITY): Payer: Self-pay | Admitting: Obstetrics and Gynecology

## 2020-12-18 ENCOUNTER — Other Ambulatory Visit (HOSPITAL_COMMUNITY): Payer: Self-pay | Admitting: Obstetrics and Gynecology

## 2020-12-29 ENCOUNTER — Other Ambulatory Visit (HOSPITAL_COMMUNITY): Payer: Self-pay | Admitting: Obstetrics and Gynecology

## 2021-01-19 ENCOUNTER — Other Ambulatory Visit (HOSPITAL_COMMUNITY): Payer: Self-pay | Admitting: Obstetrics and Gynecology

## 2021-01-25 ENCOUNTER — Other Ambulatory Visit (HOSPITAL_COMMUNITY): Payer: Self-pay | Admitting: Physician Assistant

## 2021-02-14 ENCOUNTER — Other Ambulatory Visit (HOSPITAL_COMMUNITY): Payer: Self-pay | Admitting: Obstetrics and Gynecology

## 2021-02-15 ENCOUNTER — Other Ambulatory Visit (HOSPITAL_COMMUNITY): Payer: Self-pay | Admitting: Internal Medicine

## 2021-03-11 ENCOUNTER — Other Ambulatory Visit (HOSPITAL_COMMUNITY): Payer: Self-pay

## 2021-03-11 MED ORDER — BUPROPION HCL ER (XL) 300 MG PO TB24
300.0000 mg | ORAL_TABLET | Freq: Every morning | ORAL | 12 refills | Status: DC
Start: 2021-03-11 — End: 2022-04-01
  Filled 2021-03-11 – 2021-10-11 (×2): qty 30, 30d supply, fill #0
  Filled 2021-11-08: qty 30, 30d supply, fill #1
  Filled 2021-11-11: qty 30, 30d supply, fill #0
  Filled 2021-12-10: qty 30, 30d supply, fill #1
  Filled 2022-01-07: qty 30, 30d supply, fill #2
  Filled 2022-02-02: qty 30, 30d supply, fill #3
  Filled 2022-03-04: qty 30, 30d supply, fill #4

## 2021-03-11 MED ORDER — PHENTERMINE HCL 37.5 MG PO TABS
37.5000 mg | ORAL_TABLET | Freq: Every day | ORAL | 2 refills | Status: AC
  Filled 2021-03-11 – 2021-04-12 (×2): qty 30, 30d supply, fill #0
  Filled 2021-05-12: qty 30, 30d supply, fill #1
  Filled 2021-06-10: qty 30, 30d supply, fill #2

## 2021-03-11 MED ORDER — ESTRADIOL 2 MG PO TABS
2.0000 mg | ORAL_TABLET | Freq: Every day | ORAL | 12 refills | Status: DC
Start: 2021-03-11 — End: 2022-03-15
  Filled 2021-03-11 – 2021-03-15 (×2): qty 30, 30d supply, fill #0
  Filled 2021-04-22: qty 30, 30d supply, fill #1
  Filled 2021-05-21: qty 30, 30d supply, fill #2
  Filled 2021-06-22: qty 30, 30d supply, fill #3
  Filled 2021-06-22: qty 30, 30d supply, fill #0
  Filled 2021-07-22: qty 30, 30d supply, fill #1
  Filled 2021-08-19: qty 30, 30d supply, fill #2
  Filled 2021-09-17: qty 30, 30d supply, fill #3
  Filled 2021-10-18: qty 30, 30d supply, fill #4
  Filled 2021-11-15 (×2): qty 30, 30d supply, fill #5
  Filled 2021-12-16: qty 30, 30d supply, fill #6
  Filled 2022-01-14: qty 30, 30d supply, fill #7
  Filled 2022-02-11: qty 30, 30d supply, fill #8

## 2021-03-11 MED FILL — Phentermine HCl Tab 37.5 MG: ORAL | 30 days supply | Qty: 30 | Fill #0 | Status: AC

## 2021-03-11 MED FILL — Lactulose Solution 10 GM/15ML: ORAL | 30 days supply | Qty: 900 | Fill #0 | Status: AC

## 2021-03-11 MED FILL — Bupropion HCl Tab ER 24HR 300 MG: ORAL | 30 days supply | Qty: 30 | Fill #0 | Status: AC

## 2021-03-12 ENCOUNTER — Other Ambulatory Visit (HOSPITAL_COMMUNITY): Payer: Self-pay

## 2021-03-15 ENCOUNTER — Other Ambulatory Visit (HOSPITAL_COMMUNITY): Payer: Self-pay

## 2021-03-22 ENCOUNTER — Other Ambulatory Visit (HOSPITAL_COMMUNITY): Payer: Self-pay

## 2021-03-22 ENCOUNTER — Other Ambulatory Visit (HOSPITAL_COMMUNITY): Payer: Self-pay | Admitting: Obstetrics and Gynecology

## 2021-03-22 MED FILL — Zolpidem Tartrate Tab 10 MG: ORAL | 30 days supply | Qty: 30 | Fill #0 | Status: AC

## 2021-03-23 ENCOUNTER — Other Ambulatory Visit (HOSPITAL_COMMUNITY): Payer: Self-pay

## 2021-03-24 ENCOUNTER — Other Ambulatory Visit (HOSPITAL_COMMUNITY): Payer: Self-pay

## 2021-03-25 ENCOUNTER — Other Ambulatory Visit (HOSPITAL_COMMUNITY): Payer: Self-pay

## 2021-03-25 MED ORDER — ALPRAZOLAM 0.5 MG PO TABS
0.5000 mg | ORAL_TABLET | Freq: Every day | ORAL | 0 refills | Status: DC | PRN
  Filled 2021-03-25: qty 30, 30d supply, fill #0

## 2021-03-26 ENCOUNTER — Other Ambulatory Visit (HOSPITAL_COMMUNITY): Payer: Self-pay

## 2021-03-30 ENCOUNTER — Other Ambulatory Visit (HOSPITAL_COMMUNITY): Payer: Self-pay | Admitting: Obstetrics and Gynecology

## 2021-03-30 ENCOUNTER — Other Ambulatory Visit (HOSPITAL_COMMUNITY): Payer: Self-pay

## 2021-04-01 ENCOUNTER — Other Ambulatory Visit (HOSPITAL_COMMUNITY): Payer: Self-pay

## 2021-04-09 ENCOUNTER — Other Ambulatory Visit (HOSPITAL_COMMUNITY): Payer: Self-pay

## 2021-04-09 ENCOUNTER — Other Ambulatory Visit (HOSPITAL_COMMUNITY): Payer: Self-pay | Admitting: Obstetrics and Gynecology

## 2021-04-09 MED FILL — Bupropion HCl Tab ER 24HR 300 MG: ORAL | 30 days supply | Qty: 30 | Fill #1 | Status: AC

## 2021-04-12 ENCOUNTER — Other Ambulatory Visit (HOSPITAL_COMMUNITY): Payer: Self-pay

## 2021-04-22 ENCOUNTER — Other Ambulatory Visit (HOSPITAL_COMMUNITY): Payer: Self-pay

## 2021-04-22 MED FILL — Zolpidem Tartrate Tab 10 MG: ORAL | 30 days supply | Qty: 30 | Fill #1 | Status: AC

## 2021-04-23 ENCOUNTER — Other Ambulatory Visit (HOSPITAL_COMMUNITY): Payer: Self-pay

## 2021-04-23 MED ORDER — ALPRAZOLAM 0.5 MG PO TABS
0.5000 mg | ORAL_TABLET | Freq: Every day | ORAL | 0 refills | Status: DC | PRN
  Filled 2021-04-23: qty 30, 30d supply, fill #0

## 2021-05-12 ENCOUNTER — Other Ambulatory Visit (HOSPITAL_COMMUNITY): Payer: Self-pay

## 2021-05-12 MED FILL — Bupropion HCl Tab ER 24HR 300 MG: ORAL | 30 days supply | Qty: 30 | Fill #2 | Status: AC

## 2021-05-14 ENCOUNTER — Other Ambulatory Visit (HOSPITAL_COMMUNITY): Payer: Self-pay

## 2021-05-14 MED ORDER — AMLODIPINE BESYLATE 10 MG PO TABS
10.0000 mg | ORAL_TABLET | Freq: Every day | ORAL | 0 refills | Status: DC
  Filled 2021-05-14: qty 90, 90d supply, fill #0

## 2021-05-14 MED ORDER — METOPROLOL TARTRATE 25 MG PO TABS
ORAL_TABLET | ORAL | 0 refills | Status: DC
  Filled 2021-05-14: qty 180, 90d supply, fill #0

## 2021-05-14 MED ORDER — PRAVASTATIN SODIUM 20 MG PO TABS
ORAL_TABLET | ORAL | 0 refills | Status: DC
  Filled 2021-05-14: qty 90, 90d supply, fill #0

## 2021-05-14 MED ORDER — OMEPRAZOLE 20 MG PO CPDR
1.0000 | DELAYED_RELEASE_CAPSULE | Freq: Every day | ORAL | 0 refills | Status: DC
  Filled 2021-05-14: qty 90, 90d supply, fill #0

## 2021-05-21 ENCOUNTER — Other Ambulatory Visit (HOSPITAL_COMMUNITY): Payer: Self-pay

## 2021-05-21 MED FILL — Zolpidem Tartrate Tab 10 MG: ORAL | 30 days supply | Qty: 30 | Fill #2 | Status: AC

## 2021-05-24 ENCOUNTER — Other Ambulatory Visit (HOSPITAL_COMMUNITY): Payer: Self-pay

## 2021-05-24 MED ORDER — ALPRAZOLAM 0.5 MG PO TABS
0.5000 mg | ORAL_TABLET | Freq: Every day | ORAL | 0 refills | Status: DC | PRN
  Filled 2021-05-24: qty 30, 30d supply, fill #0

## 2021-06-10 ENCOUNTER — Other Ambulatory Visit (HOSPITAL_COMMUNITY): Payer: Self-pay

## 2021-06-10 MED FILL — Bupropion HCl Tab ER 24HR 300 MG: ORAL | 30 days supply | Qty: 30 | Fill #3 | Status: AC

## 2021-06-22 ENCOUNTER — Other Ambulatory Visit (HOSPITAL_COMMUNITY): Payer: Self-pay

## 2021-06-22 MED ORDER — ALPRAZOLAM 0.5 MG PO TABS
0.5000 mg | ORAL_TABLET | Freq: Every day | ORAL | 0 refills | Status: DC | PRN
  Filled 2021-06-22: qty 30, 30d supply, fill #0

## 2021-06-22 MED FILL — Zolpidem Tartrate Tab 10 MG: ORAL | 30 days supply | Qty: 30 | Fill #3 | Status: CN

## 2021-06-22 MED FILL — Zolpidem Tartrate Tab 10 MG: ORAL | 30 days supply | Qty: 30 | Fill #0 | Status: AC

## 2021-06-23 ENCOUNTER — Other Ambulatory Visit (HOSPITAL_COMMUNITY): Payer: Self-pay

## 2021-07-09 ENCOUNTER — Other Ambulatory Visit (HOSPITAL_COMMUNITY): Payer: Self-pay

## 2021-07-09 MED FILL — Bupropion HCl Tab ER 24HR 300 MG: ORAL | 30 days supply | Qty: 30 | Fill #4 | Status: CN

## 2021-07-09 MED FILL — Bupropion HCl Tab ER 24HR 300 MG: ORAL | 30 days supply | Qty: 30 | Fill #0 | Status: AC

## 2021-07-22 ENCOUNTER — Other Ambulatory Visit (HOSPITAL_COMMUNITY): Payer: Self-pay

## 2021-07-23 ENCOUNTER — Other Ambulatory Visit (HOSPITAL_COMMUNITY): Payer: Self-pay

## 2021-07-23 MED ORDER — ZOLPIDEM TARTRATE 10 MG PO TABS
10.0000 mg | ORAL_TABLET | Freq: Every evening | ORAL | 3 refills | Status: DC | PRN
  Filled 2021-07-23: qty 30, 30d supply, fill #0
  Filled 2021-08-19: qty 30, 30d supply, fill #1
  Filled 2021-09-17: qty 30, 30d supply, fill #2
  Filled 2021-10-18: qty 30, 30d supply, fill #3

## 2021-07-23 MED ORDER — ALPRAZOLAM 0.5 MG PO TABS
ORAL_TABLET | ORAL | 0 refills | Status: DC
  Filled 2021-07-23: qty 30, 30d supply, fill #0

## 2021-07-24 ENCOUNTER — Other Ambulatory Visit (HOSPITAL_COMMUNITY): Payer: Self-pay

## 2021-08-11 ENCOUNTER — Other Ambulatory Visit (HOSPITAL_COMMUNITY): Payer: Self-pay

## 2021-08-11 MED FILL — Bupropion HCl Tab ER 24HR 300 MG: ORAL | 30 days supply | Qty: 30 | Fill #1 | Status: AC

## 2021-08-17 ENCOUNTER — Other Ambulatory Visit (HOSPITAL_COMMUNITY): Payer: Self-pay

## 2021-08-19 ENCOUNTER — Other Ambulatory Visit (HOSPITAL_COMMUNITY): Payer: Self-pay

## 2021-08-20 ENCOUNTER — Other Ambulatory Visit (HOSPITAL_COMMUNITY): Payer: Self-pay

## 2021-08-20 MED ORDER — OMEPRAZOLE 20 MG PO CPDR
20.0000 mg | DELAYED_RELEASE_CAPSULE | Freq: Every day | ORAL | 0 refills | Status: DC
  Filled 2021-08-20: qty 90, 90d supply, fill #0

## 2021-08-20 MED ORDER — AMLODIPINE BESYLATE 10 MG PO TABS
10.0000 mg | ORAL_TABLET | Freq: Every day | ORAL | 0 refills | Status: DC
  Filled 2021-08-20: qty 90, 90d supply, fill #0

## 2021-08-20 MED ORDER — PRAVASTATIN SODIUM 20 MG PO TABS
ORAL_TABLET | ORAL | 0 refills | Status: DC
  Filled 2021-08-20: qty 90, 90d supply, fill #0

## 2021-08-23 ENCOUNTER — Other Ambulatory Visit (HOSPITAL_COMMUNITY): Payer: Self-pay

## 2021-08-23 MED ORDER — METOPROLOL TARTRATE 25 MG PO TABS
ORAL_TABLET | ORAL | 0 refills | Status: DC
  Filled 2021-08-23: qty 180, 90d supply, fill #0

## 2021-08-23 MED ORDER — ALPRAZOLAM 0.5 MG PO TABS
ORAL_TABLET | ORAL | 0 refills | Status: DC
  Filled 2021-08-23: qty 30, 30d supply, fill #0

## 2021-08-25 ENCOUNTER — Other Ambulatory Visit (HOSPITAL_COMMUNITY): Payer: Self-pay

## 2021-09-09 ENCOUNTER — Other Ambulatory Visit (HOSPITAL_COMMUNITY): Payer: Self-pay

## 2021-09-09 MED FILL — Bupropion HCl Tab ER 24HR 300 MG: ORAL | 30 days supply | Qty: 30 | Fill #2 | Status: AC

## 2021-09-17 ENCOUNTER — Other Ambulatory Visit (HOSPITAL_COMMUNITY): Payer: Self-pay

## 2021-09-21 ENCOUNTER — Other Ambulatory Visit (HOSPITAL_COMMUNITY): Payer: Self-pay

## 2021-09-21 MED ORDER — ALPRAZOLAM 0.5 MG PO TABS
ORAL_TABLET | ORAL | 0 refills | Status: DC
  Filled 2021-09-21: qty 30, 30d supply, fill #0

## 2021-09-22 ENCOUNTER — Other Ambulatory Visit (HOSPITAL_COMMUNITY): Payer: Self-pay

## 2021-10-11 ENCOUNTER — Other Ambulatory Visit (HOSPITAL_COMMUNITY): Payer: Self-pay

## 2021-10-18 ENCOUNTER — Other Ambulatory Visit (HOSPITAL_COMMUNITY): Payer: Self-pay

## 2021-10-21 ENCOUNTER — Other Ambulatory Visit (HOSPITAL_COMMUNITY): Payer: Self-pay

## 2021-10-22 ENCOUNTER — Other Ambulatory Visit (HOSPITAL_COMMUNITY): Payer: Self-pay

## 2021-10-22 MED ORDER — ALPRAZOLAM 0.5 MG PO TABS
ORAL_TABLET | ORAL | 0 refills | Status: DC
  Filled 2021-10-22: qty 30, 30d supply, fill #0

## 2021-11-11 ENCOUNTER — Other Ambulatory Visit (HOSPITAL_COMMUNITY): Payer: Self-pay

## 2021-11-15 ENCOUNTER — Other Ambulatory Visit (HOSPITAL_COMMUNITY): Payer: Self-pay

## 2021-11-15 MED ORDER — ZOLPIDEM TARTRATE 10 MG PO TABS
10.0000 mg | ORAL_TABLET | Freq: Every evening | ORAL | 3 refills | Status: DC | PRN
  Filled 2021-11-15: qty 30, 30d supply, fill #0
  Filled 2021-12-16: qty 30, 30d supply, fill #1
  Filled 2022-01-14: qty 30, 30d supply, fill #2
  Filled 2022-02-11: qty 30, 30d supply, fill #3

## 2021-11-16 ENCOUNTER — Other Ambulatory Visit (HOSPITAL_COMMUNITY): Payer: Self-pay

## 2021-11-17 ENCOUNTER — Other Ambulatory Visit (HOSPITAL_COMMUNITY): Payer: Self-pay

## 2021-11-17 MED ORDER — OMEPRAZOLE 20 MG PO CPDR
20.0000 mg | DELAYED_RELEASE_CAPSULE | Freq: Every day | ORAL | 0 refills | Status: DC
  Filled 2021-11-17: qty 90, 90d supply, fill #0

## 2021-11-17 MED ORDER — METOPROLOL TARTRATE 25 MG PO TABS
ORAL_TABLET | ORAL | 0 refills | Status: DC
  Filled 2021-11-17: qty 180, 90d supply, fill #0

## 2021-11-17 MED ORDER — PRAVASTATIN SODIUM 20 MG PO TABS
ORAL_TABLET | ORAL | 0 refills | Status: DC
  Filled 2021-11-17: qty 90, 90d supply, fill #0

## 2021-11-17 MED ORDER — AMLODIPINE BESYLATE 10 MG PO TABS
10.0000 mg | ORAL_TABLET | Freq: Every day | ORAL | 0 refills | Status: DC
  Filled 2021-11-17: qty 90, 90d supply, fill #0

## 2021-11-19 ENCOUNTER — Other Ambulatory Visit (HOSPITAL_COMMUNITY): Payer: Self-pay

## 2021-11-19 MED ORDER — ALPRAZOLAM 0.5 MG PO TABS
ORAL_TABLET | ORAL | 0 refills | Status: DC
  Filled 2021-11-19: qty 30, 30d supply, fill #0

## 2021-12-07 ENCOUNTER — Other Ambulatory Visit (HOSPITAL_COMMUNITY): Payer: Self-pay

## 2021-12-07 MED ORDER — MOLNUPIRAVIR 200 MG PO CAPS
ORAL_CAPSULE | ORAL | 0 refills | Status: AC
  Filled 2021-12-07: qty 40, 5d supply, fill #0

## 2021-12-10 ENCOUNTER — Other Ambulatory Visit (HOSPITAL_COMMUNITY): Payer: Self-pay

## 2021-12-16 ENCOUNTER — Other Ambulatory Visit (HOSPITAL_COMMUNITY): Payer: Self-pay

## 2021-12-16 MED ORDER — ALPRAZOLAM 0.5 MG PO TABS
0.5000 mg | ORAL_TABLET | Freq: Every day | ORAL | 0 refills | Status: DC | PRN
  Filled 2021-12-16: qty 30, 30d supply, fill #0

## 2022-01-07 ENCOUNTER — Other Ambulatory Visit (HOSPITAL_COMMUNITY): Payer: Self-pay

## 2022-01-14 ENCOUNTER — Other Ambulatory Visit (HOSPITAL_COMMUNITY): Payer: Self-pay

## 2022-01-14 MED ORDER — ALPRAZOLAM 0.5 MG PO TABS
0.5000 mg | ORAL_TABLET | Freq: Every day | ORAL | 0 refills | Status: DC | PRN
  Filled 2022-01-14: qty 30, 30d supply, fill #0

## 2022-01-20 ENCOUNTER — Other Ambulatory Visit (HOSPITAL_COMMUNITY): Payer: Self-pay

## 2022-01-20 MED ORDER — PRAVASTATIN SODIUM 20 MG PO TABS
ORAL_TABLET | ORAL | 3 refills | Status: DC
  Filled 2022-01-20 – 2022-01-25 (×2): qty 90, 90d supply, fill #0
  Filled 2022-05-06: qty 90, 90d supply, fill #1
  Filled 2022-08-05: qty 90, 90d supply, fill #2
  Filled 2022-10-31: qty 90, 90d supply, fill #3

## 2022-01-20 MED ORDER — AMLODIPINE BESYLATE 10 MG PO TABS
10.0000 mg | ORAL_TABLET | Freq: Every day | ORAL | 3 refills | Status: DC
  Filled 2022-01-20: qty 90, 90d supply, fill #0
  Filled 2022-05-06: qty 90, 90d supply, fill #1
  Filled 2022-08-05: qty 90, 90d supply, fill #2
  Filled 2022-10-31: qty 90, 90d supply, fill #3

## 2022-01-20 MED ORDER — METOPROLOL TARTRATE 25 MG PO TABS
ORAL_TABLET | ORAL | 3 refills | Status: DC
  Filled 2022-01-20: qty 180, 90d supply, fill #0
  Filled 2022-05-06: qty 180, 90d supply, fill #1
  Filled 2022-08-05: qty 180, 90d supply, fill #2
  Filled 2022-10-31: qty 180, 90d supply, fill #3

## 2022-01-20 MED ORDER — OMEPRAZOLE 20 MG PO CPDR
20.0000 mg | DELAYED_RELEASE_CAPSULE | Freq: Every day | ORAL | 3 refills | Status: DC
  Filled 2022-01-20 – 2022-02-04 (×2): qty 90, 90d supply, fill #0
  Filled 2022-05-06: qty 90, 90d supply, fill #1
  Filled 2022-08-05: qty 90, 90d supply, fill #2
  Filled 2022-10-31: qty 90, 90d supply, fill #3

## 2022-01-21 ENCOUNTER — Other Ambulatory Visit (HOSPITAL_COMMUNITY): Payer: Self-pay

## 2022-01-25 ENCOUNTER — Other Ambulatory Visit (HOSPITAL_COMMUNITY): Payer: Self-pay

## 2022-01-26 ENCOUNTER — Other Ambulatory Visit (HOSPITAL_COMMUNITY): Payer: Self-pay

## 2022-01-28 ENCOUNTER — Other Ambulatory Visit (HOSPITAL_COMMUNITY): Payer: Self-pay

## 2022-02-02 ENCOUNTER — Other Ambulatory Visit (HOSPITAL_COMMUNITY): Payer: Self-pay

## 2022-02-04 ENCOUNTER — Other Ambulatory Visit (HOSPITAL_COMMUNITY): Payer: Self-pay

## 2022-02-11 ENCOUNTER — Other Ambulatory Visit (HOSPITAL_COMMUNITY): Payer: Self-pay

## 2022-02-11 MED ORDER — ALPRAZOLAM 0.5 MG PO TABS
0.5000 mg | ORAL_TABLET | Freq: Every day | ORAL | 0 refills | Status: DC | PRN
  Filled 2022-02-11: qty 30, 30d supply, fill #0

## 2022-02-12 ENCOUNTER — Other Ambulatory Visit (HOSPITAL_COMMUNITY): Payer: Self-pay

## 2022-03-04 ENCOUNTER — Other Ambulatory Visit (HOSPITAL_COMMUNITY): Payer: Self-pay

## 2022-03-14 ENCOUNTER — Other Ambulatory Visit (HOSPITAL_COMMUNITY): Payer: Self-pay

## 2022-03-15 ENCOUNTER — Other Ambulatory Visit (HOSPITAL_COMMUNITY): Payer: Self-pay

## 2022-03-15 MED ORDER — ESTRADIOL 2 MG PO TABS
2.0000 mg | ORAL_TABLET | Freq: Every day | ORAL | 12 refills | Status: DC
  Filled 2022-03-15: qty 30, 30d supply, fill #0
  Filled 2022-04-13: qty 30, 30d supply, fill #1
  Filled 2022-05-11: qty 30, 30d supply, fill #2
  Filled 2022-06-10: qty 30, 30d supply, fill #3
  Filled 2022-07-08: qty 30, 30d supply, fill #4
  Filled 2022-08-05: qty 30, 30d supply, fill #5
  Filled 2022-09-08: qty 30, 30d supply, fill #6
  Filled 2022-10-07: qty 30, 30d supply, fill #7
  Filled 2022-11-03: qty 30, 30d supply, fill #8
  Filled 2022-12-01: qty 30, 30d supply, fill #9
  Filled 2023-01-02: qty 30, 30d supply, fill #10
  Filled 2023-02-03: qty 30, 30d supply, fill #11
  Filled 2023-03-03: qty 30, 30d supply, fill #12

## 2022-03-15 MED ORDER — ALPRAZOLAM 0.5 MG PO TABS
0.5000 mg | ORAL_TABLET | Freq: Every day | ORAL | 0 refills | Status: DC | PRN
  Filled 2022-03-15: qty 30, 30d supply, fill #0

## 2022-03-15 MED ORDER — ZOLPIDEM TARTRATE 10 MG PO TABS
10.0000 mg | ORAL_TABLET | Freq: Every evening | ORAL | 3 refills | Status: DC | PRN
  Filled 2022-03-15: qty 30, 30d supply, fill #0
  Filled 2022-04-13: qty 30, 30d supply, fill #1
  Filled 2022-05-11: qty 30, 30d supply, fill #2
  Filled 2022-06-10: qty 30, 30d supply, fill #3

## 2022-03-16 ENCOUNTER — Other Ambulatory Visit (HOSPITAL_COMMUNITY): Payer: Self-pay

## 2022-04-01 ENCOUNTER — Other Ambulatory Visit (HOSPITAL_COMMUNITY): Payer: Self-pay

## 2022-04-01 MED ORDER — BUPROPION HCL ER (XL) 300 MG PO TB24
ORAL_TABLET | ORAL | 1 refills | Status: AC
  Filled 2022-04-01: qty 30, 30d supply, fill #0
  Filled 2022-05-06: qty 30, 30d supply, fill #1

## 2022-04-04 ENCOUNTER — Other Ambulatory Visit (HOSPITAL_COMMUNITY): Payer: Self-pay

## 2022-04-05 ENCOUNTER — Other Ambulatory Visit (HOSPITAL_COMMUNITY): Payer: Self-pay

## 2022-04-05 MED ORDER — BUPROPION HCL ER (XL) 300 MG PO TB24
ORAL_TABLET | ORAL | 12 refills | Status: DC
  Filled 2022-04-05 – 2022-06-03 (×2): qty 30, 30d supply, fill #0
  Filled 2022-07-06: qty 30, 30d supply, fill #1
  Filled 2022-08-05: qty 30, 30d supply, fill #2
  Filled 2022-09-08: qty 30, 30d supply, fill #3
  Filled 2022-10-07: qty 30, 30d supply, fill #4
  Filled 2022-11-03: qty 30, 30d supply, fill #5
  Filled 2022-12-01: qty 30, 30d supply, fill #6
  Filled 2023-01-02: qty 30, 30d supply, fill #7
  Filled 2023-02-03: qty 30, 30d supply, fill #8
  Filled 2023-03-03: qty 30, 30d supply, fill #9
  Filled 2023-04-03: qty 30, 30d supply, fill #10

## 2022-04-05 MED ORDER — ESCITALOPRAM OXALATE 10 MG PO TABS
ORAL_TABLET | ORAL | 12 refills | Status: AC
  Filled 2022-04-05: qty 30, 30d supply, fill #0

## 2022-04-13 ENCOUNTER — Other Ambulatory Visit (HOSPITAL_COMMUNITY): Payer: Self-pay

## 2022-04-13 MED ORDER — ALPRAZOLAM 0.5 MG PO TABS
0.5000 mg | ORAL_TABLET | Freq: Every day | ORAL | 0 refills | Status: DC | PRN
  Filled 2022-04-13: qty 30, 30d supply, fill #0

## 2022-04-14 ENCOUNTER — Other Ambulatory Visit (HOSPITAL_COMMUNITY): Payer: Self-pay

## 2022-05-06 ENCOUNTER — Other Ambulatory Visit (HOSPITAL_COMMUNITY): Payer: Self-pay

## 2022-05-11 ENCOUNTER — Other Ambulatory Visit (HOSPITAL_COMMUNITY): Payer: Self-pay

## 2022-05-12 ENCOUNTER — Other Ambulatory Visit (HOSPITAL_COMMUNITY): Payer: Self-pay

## 2022-05-12 MED ORDER — ALPRAZOLAM 0.5 MG PO TABS
0.5000 mg | ORAL_TABLET | Freq: Every day | ORAL | 0 refills | Status: DC | PRN
  Filled 2022-05-12: qty 30, 30d supply, fill #0

## 2022-05-13 ENCOUNTER — Other Ambulatory Visit (HOSPITAL_COMMUNITY): Payer: Self-pay

## 2022-06-03 ENCOUNTER — Other Ambulatory Visit (HOSPITAL_COMMUNITY): Payer: Self-pay

## 2022-06-10 ENCOUNTER — Other Ambulatory Visit (HOSPITAL_COMMUNITY): Payer: Self-pay

## 2022-06-10 MED ORDER — ALPRAZOLAM 0.5 MG PO TABS
0.5000 mg | ORAL_TABLET | Freq: Every day | ORAL | 0 refills | Status: DC | PRN
  Filled 2022-06-10: qty 30, 30d supply, fill #0

## 2022-06-11 ENCOUNTER — Other Ambulatory Visit (HOSPITAL_COMMUNITY): Payer: Self-pay

## 2022-06-13 ENCOUNTER — Other Ambulatory Visit (HOSPITAL_COMMUNITY): Payer: Self-pay

## 2022-07-06 ENCOUNTER — Other Ambulatory Visit (HOSPITAL_COMMUNITY): Payer: Self-pay

## 2022-07-08 ENCOUNTER — Other Ambulatory Visit (HOSPITAL_COMMUNITY): Payer: Self-pay

## 2022-07-11 ENCOUNTER — Other Ambulatory Visit (HOSPITAL_COMMUNITY): Payer: Self-pay

## 2022-07-12 ENCOUNTER — Other Ambulatory Visit (HOSPITAL_COMMUNITY): Payer: Self-pay

## 2022-07-12 MED ORDER — ZOLPIDEM TARTRATE 10 MG PO TABS
10.0000 mg | ORAL_TABLET | Freq: Every evening | ORAL | 3 refills | Status: DC | PRN
  Filled 2022-07-12: qty 30, 30d supply, fill #0
  Filled 2022-08-12: qty 30, 30d supply, fill #1
  Filled 2022-09-09: qty 30, 30d supply, fill #2
  Filled 2022-10-07: qty 30, 30d supply, fill #3

## 2022-07-12 MED ORDER — ALPRAZOLAM 0.5 MG PO TABS
0.5000 mg | ORAL_TABLET | Freq: Every day | ORAL | 0 refills | Status: DC | PRN
  Filled 2022-07-12: qty 30, 30d supply, fill #0

## 2022-08-05 ENCOUNTER — Other Ambulatory Visit (HOSPITAL_COMMUNITY): Payer: Self-pay

## 2022-08-12 ENCOUNTER — Other Ambulatory Visit (HOSPITAL_COMMUNITY): Payer: Self-pay

## 2022-08-12 MED ORDER — ALPRAZOLAM 0.5 MG PO TABS
0.5000 mg | ORAL_TABLET | Freq: Every day | ORAL | 0 refills | Status: DC | PRN
  Filled 2022-08-12: qty 30, 30d supply, fill #0

## 2022-08-16 ENCOUNTER — Other Ambulatory Visit (HOSPITAL_COMMUNITY): Payer: Self-pay

## 2022-09-08 ENCOUNTER — Other Ambulatory Visit (HOSPITAL_COMMUNITY): Payer: Self-pay

## 2022-09-08 MED ORDER — ALPRAZOLAM 0.5 MG PO TABS
0.5000 mg | ORAL_TABLET | Freq: Every day | ORAL | 0 refills | Status: DC | PRN
  Filled 2022-09-14: qty 30, 30d supply, fill #0

## 2022-09-09 ENCOUNTER — Other Ambulatory Visit (HOSPITAL_COMMUNITY): Payer: Self-pay

## 2022-09-14 ENCOUNTER — Other Ambulatory Visit (HOSPITAL_COMMUNITY): Payer: Self-pay

## 2022-09-15 ENCOUNTER — Other Ambulatory Visit (HOSPITAL_COMMUNITY): Payer: Self-pay

## 2022-09-29 ENCOUNTER — Other Ambulatory Visit (HOSPITAL_COMMUNITY): Payer: Self-pay

## 2022-09-30 ENCOUNTER — Other Ambulatory Visit (HOSPITAL_COMMUNITY): Payer: Self-pay

## 2022-10-07 ENCOUNTER — Other Ambulatory Visit (HOSPITAL_COMMUNITY): Payer: Self-pay

## 2022-10-13 ENCOUNTER — Other Ambulatory Visit (HOSPITAL_COMMUNITY): Payer: Self-pay

## 2022-10-14 ENCOUNTER — Other Ambulatory Visit (HOSPITAL_COMMUNITY): Payer: Self-pay

## 2022-10-14 MED ORDER — ALPRAZOLAM 0.5 MG PO TABS
0.5000 mg | ORAL_TABLET | Freq: Every day | ORAL | 0 refills | Status: DC | PRN
  Filled 2022-10-14: qty 30, 30d supply, fill #0

## 2022-10-31 ENCOUNTER — Other Ambulatory Visit (HOSPITAL_COMMUNITY): Payer: Self-pay

## 2022-11-03 ENCOUNTER — Other Ambulatory Visit (HOSPITAL_COMMUNITY): Payer: Self-pay

## 2022-11-04 ENCOUNTER — Other Ambulatory Visit (HOSPITAL_COMMUNITY): Payer: Self-pay

## 2022-11-04 MED ORDER — ZOLPIDEM TARTRATE 10 MG PO TABS
10.0000 mg | ORAL_TABLET | Freq: Every evening | ORAL | 3 refills | Status: DC | PRN
  Filled 2022-11-04: qty 30, 30d supply, fill #0
  Filled 2022-12-01 – 2022-12-06 (×2): qty 30, 30d supply, fill #1
  Filled 2023-01-10: qty 30, 30d supply, fill #2
  Filled 2023-02-13: qty 30, 30d supply, fill #3

## 2022-11-10 ENCOUNTER — Other Ambulatory Visit: Payer: Self-pay

## 2022-11-10 ENCOUNTER — Other Ambulatory Visit (HOSPITAL_COMMUNITY): Payer: Self-pay

## 2022-11-11 ENCOUNTER — Other Ambulatory Visit (HOSPITAL_COMMUNITY): Payer: Self-pay

## 2022-11-11 MED ORDER — PEG 3350-KCL-NA BICARB-NACL 420 G PO SOLR
ORAL | 0 refills | Status: AC
  Filled 2022-11-11: qty 4000, 1d supply, fill #0

## 2022-11-11 MED ORDER — ALPRAZOLAM 0.5 MG PO TABS
0.5000 mg | ORAL_TABLET | Freq: Every day | ORAL | 0 refills | Status: DC | PRN
  Filled 2022-11-11: qty 30, 30d supply, fill #0

## 2022-12-01 ENCOUNTER — Other Ambulatory Visit (HOSPITAL_COMMUNITY): Payer: Self-pay

## 2022-12-06 ENCOUNTER — Other Ambulatory Visit (HOSPITAL_COMMUNITY): Payer: Self-pay

## 2022-12-06 ENCOUNTER — Other Ambulatory Visit: Payer: Self-pay

## 2022-12-09 ENCOUNTER — Other Ambulatory Visit (HOSPITAL_COMMUNITY): Payer: Self-pay

## 2022-12-12 ENCOUNTER — Other Ambulatory Visit (HOSPITAL_COMMUNITY): Payer: Self-pay

## 2022-12-12 MED ORDER — ALPRAZOLAM 0.5 MG PO TABS
0.5000 mg | ORAL_TABLET | Freq: Every day | ORAL | 0 refills | Status: DC | PRN
  Filled 2022-12-12 (×2): qty 30, 30d supply, fill #0

## 2023-01-02 ENCOUNTER — Other Ambulatory Visit (HOSPITAL_COMMUNITY): Payer: Self-pay

## 2023-01-10 ENCOUNTER — Other Ambulatory Visit: Payer: Self-pay

## 2023-01-10 ENCOUNTER — Other Ambulatory Visit (HOSPITAL_COMMUNITY): Payer: Self-pay

## 2023-01-14 ENCOUNTER — Other Ambulatory Visit (HOSPITAL_COMMUNITY): Payer: Self-pay

## 2023-01-16 ENCOUNTER — Other Ambulatory Visit: Payer: Self-pay

## 2023-01-16 ENCOUNTER — Other Ambulatory Visit (HOSPITAL_COMMUNITY): Payer: Self-pay

## 2023-01-16 MED ORDER — ALPRAZOLAM 0.5 MG PO TABS
0.5000 mg | ORAL_TABLET | Freq: Every day | ORAL | 0 refills | Status: DC | PRN
  Filled 2023-01-16: qty 30, 30d supply, fill #0

## 2023-01-24 ENCOUNTER — Other Ambulatory Visit (HOSPITAL_COMMUNITY): Payer: Self-pay

## 2023-01-24 ENCOUNTER — Other Ambulatory Visit: Payer: Self-pay

## 2023-01-24 MED ORDER — PRAVASTATIN SODIUM 20 MG PO TABS
20.0000 mg | ORAL_TABLET | Freq: Every evening | ORAL | 3 refills | Status: DC
  Filled 2023-01-24: qty 90, 90d supply, fill #0
  Filled 2023-04-28: qty 90, 90d supply, fill #1
  Filled 2023-07-26: qty 90, 90d supply, fill #2
  Filled 2023-10-25: qty 90, 90d supply, fill #3

## 2023-01-24 MED ORDER — METOPROLOL TARTRATE 25 MG PO TABS
25.0000 mg | ORAL_TABLET | Freq: Two times a day (BID) | ORAL | 3 refills | Status: DC
  Filled 2023-01-24: qty 180, 90d supply, fill #0
  Filled 2023-04-28: qty 180, 90d supply, fill #1
  Filled 2023-07-26: qty 180, 90d supply, fill #2
  Filled 2023-10-25: qty 180, 90d supply, fill #3

## 2023-01-24 MED ORDER — OMEPRAZOLE 20 MG PO CPDR
20.0000 mg | DELAYED_RELEASE_CAPSULE | Freq: Every day | ORAL | 3 refills | Status: DC
  Filled 2023-01-24: qty 90, 90d supply, fill #0
  Filled 2023-04-28: qty 90, 90d supply, fill #1
  Filled 2023-07-26: qty 90, 90d supply, fill #2
  Filled 2023-10-25: qty 90, 90d supply, fill #3

## 2023-01-24 MED ORDER — AMLODIPINE BESYLATE 10 MG PO TABS
10.0000 mg | ORAL_TABLET | Freq: Every day | ORAL | 3 refills | Status: AC
  Filled 2023-01-24: qty 90, 90d supply, fill #0
  Filled 2023-04-28: qty 90, 90d supply, fill #1
  Filled 2023-07-26: qty 90, 90d supply, fill #2
  Filled 2023-10-25: qty 90, 90d supply, fill #3

## 2023-02-03 ENCOUNTER — Other Ambulatory Visit (HOSPITAL_COMMUNITY): Payer: Self-pay

## 2023-02-13 ENCOUNTER — Other Ambulatory Visit (HOSPITAL_COMMUNITY): Payer: Self-pay

## 2023-02-13 ENCOUNTER — Other Ambulatory Visit: Payer: Self-pay

## 2023-02-14 ENCOUNTER — Other Ambulatory Visit (HOSPITAL_COMMUNITY): Payer: Self-pay

## 2023-02-14 MED ORDER — ALPRAZOLAM 0.5 MG PO TABS
0.5000 mg | ORAL_TABLET | Freq: Every day | ORAL | 0 refills | Status: DC | PRN
  Filled 2023-02-14: qty 30, 30d supply, fill #0

## 2023-02-15 ENCOUNTER — Other Ambulatory Visit (HOSPITAL_COMMUNITY): Payer: Self-pay

## 2023-03-03 ENCOUNTER — Other Ambulatory Visit (HOSPITAL_COMMUNITY): Payer: Self-pay

## 2023-03-07 ENCOUNTER — Other Ambulatory Visit (HOSPITAL_COMMUNITY): Payer: Self-pay

## 2023-03-15 ENCOUNTER — Other Ambulatory Visit (HOSPITAL_COMMUNITY): Payer: Self-pay

## 2023-03-15 ENCOUNTER — Other Ambulatory Visit (HOSPITAL_BASED_OUTPATIENT_CLINIC_OR_DEPARTMENT_OTHER): Payer: Self-pay

## 2023-03-15 MED ORDER — ALPRAZOLAM 0.5 MG PO TABS
0.5000 mg | ORAL_TABLET | Freq: Every day | ORAL | 0 refills | Status: DC | PRN
  Filled 2023-03-15: qty 30, 30d supply, fill #0

## 2023-03-15 MED ORDER — ZOLPIDEM TARTRATE 10 MG PO TABS
10.0000 mg | ORAL_TABLET | Freq: Every evening | ORAL | 3 refills | Status: AC | PRN
  Filled 2023-03-15: qty 30, 30d supply, fill #0
  Filled 2023-04-13: qty 30, 30d supply, fill #1
  Filled 2023-05-18 (×2): qty 30, 30d supply, fill #2
  Filled 2023-06-15 (×3): qty 30, 30d supply, fill #3

## 2023-03-17 ENCOUNTER — Other Ambulatory Visit: Payer: Self-pay

## 2023-04-03 ENCOUNTER — Other Ambulatory Visit (HOSPITAL_COMMUNITY): Payer: Self-pay

## 2023-04-03 ENCOUNTER — Other Ambulatory Visit: Payer: Self-pay

## 2023-04-03 MED ORDER — TAMSULOSIN HCL 0.4 MG PO CAPS
0.4000 mg | ORAL_CAPSULE | Freq: Every day | ORAL | 5 refills | Status: DC
  Filled 2023-04-03 (×2): qty 30, 30d supply, fill #0
  Filled 2023-05-02: qty 30, 30d supply, fill #1
  Filled 2023-07-12: qty 30, 30d supply, fill #2
  Filled 2023-08-11: qty 30, 30d supply, fill #3
  Filled 2023-09-09: qty 30, 30d supply, fill #4
  Filled 2023-10-11: qty 30, 30d supply, fill #5

## 2023-04-03 MED ORDER — ESTRADIOL 2 MG PO TABS
2.0000 mg | ORAL_TABLET | Freq: Every day | ORAL | 1 refills | Status: AC
  Filled 2023-04-03: qty 30, 30d supply, fill #0
  Filled 2024-01-26: qty 30, 30d supply, fill #1

## 2023-04-04 ENCOUNTER — Other Ambulatory Visit (HOSPITAL_COMMUNITY): Payer: Self-pay

## 2023-04-13 ENCOUNTER — Other Ambulatory Visit: Payer: Self-pay

## 2023-04-13 ENCOUNTER — Other Ambulatory Visit (HOSPITAL_COMMUNITY): Payer: Self-pay

## 2023-04-17 ENCOUNTER — Other Ambulatory Visit (HOSPITAL_COMMUNITY): Payer: Self-pay

## 2023-04-17 MED ORDER — ALPRAZOLAM 0.5 MG PO TABS
0.5000 mg | ORAL_TABLET | Freq: Every day | ORAL | 0 refills | Status: DC | PRN
  Filled 2023-04-17: qty 30, 30d supply, fill #0

## 2023-04-18 ENCOUNTER — Other Ambulatory Visit (HOSPITAL_COMMUNITY): Payer: Self-pay

## 2023-04-28 ENCOUNTER — Other Ambulatory Visit (HOSPITAL_COMMUNITY): Payer: Self-pay

## 2023-05-02 ENCOUNTER — Other Ambulatory Visit (HOSPITAL_COMMUNITY): Payer: Self-pay

## 2023-05-03 ENCOUNTER — Other Ambulatory Visit (HOSPITAL_COMMUNITY): Payer: Self-pay

## 2023-05-03 ENCOUNTER — Other Ambulatory Visit: Payer: Self-pay

## 2023-05-03 MED ORDER — ZOLPIDEM TARTRATE 10 MG PO TABS
10.0000 mg | ORAL_TABLET | Freq: Every evening | ORAL | 5 refills | Status: DC | PRN
  Filled 2023-07-12 – 2023-07-14 (×2): qty 30, 30d supply, fill #0
  Filled 2023-08-11: qty 30, 30d supply, fill #1
  Filled 2023-09-11: qty 30, 30d supply, fill #2
  Filled 2023-10-11: qty 30, 30d supply, fill #3

## 2023-05-03 MED ORDER — ALPRAZOLAM 0.5 MG PO TABS
0.5000 mg | ORAL_TABLET | Freq: Every day | ORAL | 0 refills | Status: DC | PRN
  Filled 2023-05-03 – 2023-05-18 (×2): qty 30, 30d supply, fill #0

## 2023-05-03 MED ORDER — ESTRADIOL 2 MG PO TABS
2.0000 mg | ORAL_TABLET | Freq: Every day | ORAL | 4 refills | Status: DC
  Filled 2023-05-03 (×2): qty 90, 90d supply, fill #0
  Filled 2023-08-01: qty 90, 90d supply, fill #1
  Filled 2023-10-25: qty 90, 90d supply, fill #2
  Filled 2024-02-27: qty 90, 90d supply, fill #3

## 2023-05-03 MED ORDER — BUPROPION HCL ER (XL) 300 MG PO TB24
300.0000 mg | ORAL_TABLET | Freq: Every morning | ORAL | 12 refills | Status: DC
  Filled 2023-05-03: qty 90, 90d supply, fill #0
  Filled 2023-08-01: qty 90, 90d supply, fill #1
  Filled 2023-10-25: qty 90, 90d supply, fill #2
  Filled 2024-01-26: qty 90, 90d supply, fill #3
  Filled 2024-04-25: qty 90, 90d supply, fill #4

## 2023-05-04 ENCOUNTER — Other Ambulatory Visit (HOSPITAL_COMMUNITY): Payer: Self-pay

## 2023-05-18 ENCOUNTER — Other Ambulatory Visit: Payer: Self-pay

## 2023-05-18 ENCOUNTER — Other Ambulatory Visit (HOSPITAL_COMMUNITY): Payer: Self-pay

## 2023-05-30 ENCOUNTER — Other Ambulatory Visit (HOSPITAL_COMMUNITY): Payer: Self-pay

## 2023-05-30 MED ORDER — URIBEL 118 MG PO CAPS
118.0000 mg | ORAL_CAPSULE | Freq: Four times a day (QID) | ORAL | 3 refills | Status: AC | PRN
  Filled 2023-05-30: qty 20, 5d supply, fill #0

## 2023-05-31 ENCOUNTER — Other Ambulatory Visit (HOSPITAL_COMMUNITY): Payer: Self-pay

## 2023-06-15 ENCOUNTER — Other Ambulatory Visit (HOSPITAL_COMMUNITY): Payer: Self-pay

## 2023-06-15 ENCOUNTER — Other Ambulatory Visit: Payer: Self-pay

## 2023-06-15 MED ORDER — ALPRAZOLAM 0.5 MG PO TABS
0.5000 mg | ORAL_TABLET | Freq: Every day | ORAL | 0 refills | Status: DC | PRN
  Filled 2023-06-15: qty 30, 30d supply, fill #0

## 2023-06-16 ENCOUNTER — Other Ambulatory Visit: Payer: Self-pay

## 2023-06-17 ENCOUNTER — Other Ambulatory Visit (HOSPITAL_COMMUNITY): Payer: Self-pay

## 2023-06-20 ENCOUNTER — Other Ambulatory Visit: Payer: Self-pay

## 2023-06-21 ENCOUNTER — Other Ambulatory Visit (HOSPITAL_BASED_OUTPATIENT_CLINIC_OR_DEPARTMENT_OTHER): Payer: Self-pay

## 2023-06-21 ENCOUNTER — Other Ambulatory Visit (HOSPITAL_COMMUNITY): Payer: Self-pay

## 2023-06-21 MED ORDER — NITROFURANTOIN MONOHYD MACRO 100 MG PO CAPS
100.0000 mg | ORAL_CAPSULE | Freq: Every day | ORAL | 0 refills | Status: AC
  Filled 2023-06-21: qty 30, 30d supply, fill #0

## 2023-07-12 ENCOUNTER — Other Ambulatory Visit (HOSPITAL_COMMUNITY): Payer: Self-pay

## 2023-07-12 MED ORDER — ALPRAZOLAM 0.5 MG PO TABS
0.5000 mg | ORAL_TABLET | Freq: Every day | ORAL | 0 refills | Status: DC | PRN
  Filled 2023-07-12 – 2023-07-14 (×2): qty 30, 30d supply, fill #0

## 2023-07-13 ENCOUNTER — Other Ambulatory Visit (HOSPITAL_COMMUNITY): Payer: Self-pay

## 2023-07-14 ENCOUNTER — Other Ambulatory Visit (HOSPITAL_COMMUNITY): Payer: Self-pay

## 2023-07-14 ENCOUNTER — Other Ambulatory Visit: Payer: Self-pay

## 2023-07-26 ENCOUNTER — Other Ambulatory Visit (HOSPITAL_COMMUNITY): Payer: Self-pay

## 2023-08-01 ENCOUNTER — Other Ambulatory Visit (HOSPITAL_COMMUNITY): Payer: Self-pay

## 2023-08-02 ENCOUNTER — Other Ambulatory Visit (HOSPITAL_COMMUNITY): Payer: Self-pay

## 2023-08-04 ENCOUNTER — Other Ambulatory Visit (HOSPITAL_COMMUNITY): Payer: Self-pay

## 2023-08-11 ENCOUNTER — Other Ambulatory Visit (HOSPITAL_COMMUNITY): Payer: Self-pay

## 2023-08-11 ENCOUNTER — Other Ambulatory Visit: Payer: Self-pay

## 2023-08-11 MED ORDER — ALPRAZOLAM 0.5 MG PO TABS
0.5000 mg | ORAL_TABLET | Freq: Every day | ORAL | 0 refills | Status: DC | PRN
  Filled 2023-08-11: qty 30, 30d supply, fill #0

## 2023-08-14 ENCOUNTER — Other Ambulatory Visit (HOSPITAL_COMMUNITY): Payer: Self-pay

## 2023-09-08 ENCOUNTER — Other Ambulatory Visit (HOSPITAL_COMMUNITY): Payer: Self-pay

## 2023-09-08 MED ORDER — ALPRAZOLAM 0.5 MG PO TABS
0.5000 mg | ORAL_TABLET | Freq: Every day | ORAL | 0 refills | Status: DC | PRN
  Filled 2023-09-08 – 2023-09-12 (×2): qty 30, 30d supply, fill #0

## 2023-09-09 ENCOUNTER — Other Ambulatory Visit (HOSPITAL_COMMUNITY): Payer: Self-pay

## 2023-09-12 ENCOUNTER — Other Ambulatory Visit (HOSPITAL_COMMUNITY): Payer: Self-pay

## 2023-10-11 ENCOUNTER — Other Ambulatory Visit (HOSPITAL_COMMUNITY): Payer: Self-pay

## 2023-10-11 ENCOUNTER — Other Ambulatory Visit: Payer: Self-pay

## 2023-10-12 ENCOUNTER — Other Ambulatory Visit (HOSPITAL_COMMUNITY): Payer: Self-pay

## 2023-10-12 MED ORDER — ALPRAZOLAM 0.5 MG PO TABS
0.5000 mg | ORAL_TABLET | Freq: Every day | ORAL | 0 refills | Status: DC | PRN
Start: 2023-10-11 — End: 2023-11-10
  Filled 2023-10-12: qty 30, 30d supply, fill #0

## 2023-10-25 ENCOUNTER — Other Ambulatory Visit (HOSPITAL_COMMUNITY): Payer: Self-pay

## 2023-10-26 ENCOUNTER — Other Ambulatory Visit (HOSPITAL_COMMUNITY): Payer: Self-pay

## 2023-10-26 ENCOUNTER — Other Ambulatory Visit: Payer: Self-pay

## 2023-10-26 MED ORDER — FUROSEMIDE 20 MG PO TABS
20.0000 mg | ORAL_TABLET | ORAL | 2 refills | Status: AC
  Filled 2023-10-26 (×2): qty 30, 77d supply, fill #0
  Filled 2023-12-20: qty 30, 77d supply, fill #1
  Filled 2024-01-22: qty 30, 77d supply, fill #2

## 2023-10-31 ENCOUNTER — Other Ambulatory Visit (HOSPITAL_COMMUNITY): Payer: Self-pay

## 2023-11-01 ENCOUNTER — Other Ambulatory Visit (HOSPITAL_COMMUNITY): Payer: Self-pay

## 2023-11-03 ENCOUNTER — Other Ambulatory Visit (HOSPITAL_COMMUNITY): Payer: Self-pay

## 2023-11-09 ENCOUNTER — Other Ambulatory Visit (HOSPITAL_COMMUNITY): Payer: Self-pay

## 2023-11-10 ENCOUNTER — Other Ambulatory Visit (HOSPITAL_COMMUNITY): Payer: Self-pay

## 2023-11-10 MED ORDER — ZOLPIDEM TARTRATE 10 MG PO TABS
10.0000 mg | ORAL_TABLET | Freq: Every evening | ORAL | 5 refills | Status: DC | PRN
  Filled 2023-11-10: qty 30, 30d supply, fill #0
  Filled 2023-12-08: qty 30, 30d supply, fill #1
  Filled 2024-01-10: qty 30, 30d supply, fill #2
  Filled 2024-02-06: qty 30, 30d supply, fill #3
  Filled 2024-03-08: qty 30, 30d supply, fill #4
  Filled 2024-04-05: qty 30, 30d supply, fill #5

## 2023-11-10 MED ORDER — ALPRAZOLAM 0.5 MG PO TABS
0.5000 mg | ORAL_TABLET | Freq: Every day | ORAL | 0 refills | Status: DC | PRN
  Filled 2023-11-10: qty 30, 30d supply, fill #0

## 2023-11-20 ENCOUNTER — Other Ambulatory Visit (HOSPITAL_COMMUNITY): Payer: Self-pay

## 2023-11-20 MED ORDER — TAMSULOSIN HCL 0.4 MG PO CAPS
0.4000 mg | ORAL_CAPSULE | Freq: Every day | ORAL | 5 refills | Status: DC
  Filled 2023-11-20: qty 30, 30d supply, fill #0
  Filled 2023-12-20: qty 30, 30d supply, fill #1
  Filled 2024-01-22 (×3): qty 30, 30d supply, fill #2
  Filled 2024-02-20: qty 30, 30d supply, fill #3
  Filled 2024-03-20: qty 30, 30d supply, fill #4
  Filled 2024-04-19: qty 30, 30d supply, fill #5

## 2023-11-22 ENCOUNTER — Other Ambulatory Visit (HOSPITAL_COMMUNITY): Payer: Self-pay

## 2023-11-23 ENCOUNTER — Other Ambulatory Visit (HOSPITAL_COMMUNITY): Payer: Self-pay

## 2023-12-08 ENCOUNTER — Other Ambulatory Visit: Payer: Self-pay

## 2023-12-08 ENCOUNTER — Other Ambulatory Visit (HOSPITAL_COMMUNITY): Payer: Self-pay

## 2023-12-10 ENCOUNTER — Other Ambulatory Visit (HOSPITAL_COMMUNITY): Payer: Self-pay

## 2023-12-10 MED ORDER — ALPRAZOLAM 0.5 MG PO TABS
ORAL_TABLET | ORAL | 0 refills | Status: DC
  Filled 2023-12-10: qty 30, 30d supply, fill #0

## 2023-12-11 ENCOUNTER — Other Ambulatory Visit (HOSPITAL_COMMUNITY): Payer: Self-pay

## 2023-12-20 ENCOUNTER — Other Ambulatory Visit (HOSPITAL_COMMUNITY): Payer: Self-pay

## 2023-12-26 ENCOUNTER — Other Ambulatory Visit (HOSPITAL_COMMUNITY): Payer: Self-pay

## 2024-01-10 ENCOUNTER — Other Ambulatory Visit (HOSPITAL_COMMUNITY): Payer: Self-pay

## 2024-01-10 ENCOUNTER — Other Ambulatory Visit: Payer: Self-pay

## 2024-01-10 MED ORDER — ALPRAZOLAM 0.5 MG PO TABS
0.5000 mg | ORAL_TABLET | Freq: Every day | ORAL | 0 refills | Status: DC | PRN
  Filled 2024-01-10: qty 30, 30d supply, fill #0

## 2024-01-22 ENCOUNTER — Other Ambulatory Visit (HOSPITAL_COMMUNITY): Payer: Self-pay

## 2024-01-26 ENCOUNTER — Other Ambulatory Visit: Payer: Self-pay

## 2024-01-26 ENCOUNTER — Other Ambulatory Visit (HOSPITAL_COMMUNITY): Payer: Self-pay

## 2024-01-26 MED ORDER — METOPROLOL TARTRATE 25 MG PO TABS
25.0000 mg | ORAL_TABLET | Freq: Two times a day (BID) | ORAL | 3 refills | Status: AC
  Filled 2024-01-26: qty 180, 90d supply, fill #0
  Filled 2024-04-24: qty 180, 90d supply, fill #1
  Filled 2024-07-22: qty 180, 90d supply, fill #2
  Filled 2024-10-21: qty 180, 90d supply, fill #3

## 2024-01-26 MED ORDER — PRAVASTATIN SODIUM 20 MG PO TABS
20.0000 mg | ORAL_TABLET | Freq: Every evening | ORAL | 3 refills | Status: AC
  Filled 2024-01-26: qty 90, 90d supply, fill #0
  Filled 2024-04-24: qty 90, 90d supply, fill #1
  Filled 2024-07-22: qty 90, 90d supply, fill #2
  Filled 2024-10-21: qty 90, 90d supply, fill #3

## 2024-01-29 ENCOUNTER — Other Ambulatory Visit (HOSPITAL_COMMUNITY): Payer: Self-pay

## 2024-01-29 MED ORDER — PHENTERMINE HCL 30 MG PO CAPS
30.0000 mg | ORAL_CAPSULE | Freq: Every day | ORAL | 2 refills | Status: DC
Start: 2024-01-29 — End: 2024-04-25
  Filled 2024-01-29: qty 30, 30d supply, fill #0
  Filled 2024-02-27: qty 30, 30d supply, fill #1
  Filled 2024-03-27: qty 30, 30d supply, fill #2

## 2024-01-29 MED ORDER — OMEPRAZOLE 20 MG PO CPDR
20.0000 mg | DELAYED_RELEASE_CAPSULE | Freq: Every day | ORAL | 3 refills | Status: AC
Start: 2024-01-26 — End: ?
  Filled 2024-01-29: qty 90, 90d supply, fill #0
  Filled 2024-04-24: qty 90, 90d supply, fill #1
  Filled 2024-07-22: qty 90, 90d supply, fill #2
  Filled 2024-10-21: qty 90, 90d supply, fill #3

## 2024-01-29 MED ORDER — FUROSEMIDE 20 MG PO TABS
20.0000 mg | ORAL_TABLET | Freq: Every day | ORAL | 1 refills | Status: DC
  Filled 2024-01-29: qty 90, 90d supply, fill #0
  Filled 2024-04-24: qty 90, 90d supply, fill #1

## 2024-01-29 MED ORDER — AMLODIPINE BESYLATE 5 MG PO TABS
5.0000 mg | ORAL_TABLET | Freq: Every day | ORAL | 1 refills | Status: DC
  Filled 2024-01-29: qty 90, 90d supply, fill #0
  Filled 2024-04-24: qty 90, 90d supply, fill #1

## 2024-01-30 ENCOUNTER — Other Ambulatory Visit (HOSPITAL_COMMUNITY): Payer: Self-pay

## 2024-02-06 ENCOUNTER — Other Ambulatory Visit (HOSPITAL_COMMUNITY): Payer: Self-pay

## 2024-02-06 MED ORDER — ALPRAZOLAM 0.5 MG PO TABS
0.5000 mg | ORAL_TABLET | Freq: Every day | ORAL | 0 refills | Status: DC | PRN
  Filled 2024-02-09: qty 30, 30d supply, fill #0

## 2024-02-08 ENCOUNTER — Other Ambulatory Visit (HOSPITAL_COMMUNITY): Payer: Self-pay

## 2024-02-09 ENCOUNTER — Other Ambulatory Visit (HOSPITAL_COMMUNITY): Payer: Self-pay

## 2024-02-27 ENCOUNTER — Other Ambulatory Visit: Payer: Self-pay

## 2024-03-08 ENCOUNTER — Other Ambulatory Visit: Payer: Self-pay

## 2024-03-08 ENCOUNTER — Other Ambulatory Visit (HOSPITAL_COMMUNITY): Payer: Self-pay

## 2024-03-08 MED ORDER — ALPRAZOLAM 0.5 MG PO TABS
0.5000 mg | ORAL_TABLET | Freq: Every day | ORAL | 0 refills | Status: DC | PRN
  Filled 2024-03-08: qty 30, 30d supply, fill #0

## 2024-03-27 ENCOUNTER — Other Ambulatory Visit: Payer: Self-pay

## 2024-04-05 ENCOUNTER — Other Ambulatory Visit (HOSPITAL_COMMUNITY): Payer: Self-pay

## 2024-04-08 ENCOUNTER — Other Ambulatory Visit (HOSPITAL_COMMUNITY): Payer: Self-pay

## 2024-04-08 ENCOUNTER — Other Ambulatory Visit: Payer: Self-pay

## 2024-04-08 MED ORDER — ALPRAZOLAM 0.5 MG PO TABS
0.5000 mg | ORAL_TABLET | Freq: Every day | ORAL | 0 refills | Status: AC
  Filled 2024-04-08: qty 30, 30d supply, fill #0

## 2024-04-24 ENCOUNTER — Other Ambulatory Visit (HOSPITAL_COMMUNITY): Payer: Self-pay

## 2024-04-25 ENCOUNTER — Other Ambulatory Visit (HOSPITAL_COMMUNITY): Payer: Self-pay

## 2024-04-25 MED ORDER — PHENTERMINE HCL 37.5 MG PO TABS
37.5000 mg | ORAL_TABLET | Freq: Every day | ORAL | 2 refills | Status: DC
  Filled 2024-04-25: qty 30, 30d supply, fill #0
  Filled 2024-05-24: qty 30, 30d supply, fill #1
  Filled 2024-06-26: qty 30, 30d supply, fill #2

## 2024-05-06 ENCOUNTER — Other Ambulatory Visit (HOSPITAL_COMMUNITY): Payer: Self-pay

## 2024-05-06 MED ORDER — ESTRADIOL 2 MG PO TABS
2.0000 mg | ORAL_TABLET | Freq: Every day | ORAL | 4 refills | Status: AC
  Filled 2024-05-06: qty 90, 90d supply, fill #0
  Filled 2024-08-21: qty 90, 90d supply, fill #1
  Filled 2024-11-19: qty 90, 90d supply, fill #2

## 2024-05-06 MED ORDER — ALPRAZOLAM 0.5 MG PO TABS
0.5000 mg | ORAL_TABLET | Freq: Every day | ORAL | 0 refills | Status: DC | PRN
  Filled 2024-05-06: qty 30, 30d supply, fill #0

## 2024-05-06 MED ORDER — ZOLPIDEM TARTRATE 10 MG PO TABS
10.0000 mg | ORAL_TABLET | Freq: Every evening | ORAL | 5 refills | Status: DC | PRN
  Filled 2024-05-06: qty 30, 30d supply, fill #0
  Filled 2024-06-06: qty 30, 30d supply, fill #1
  Filled 2024-07-04: qty 30, 30d supply, fill #2
  Filled 2024-08-02: qty 30, 30d supply, fill #3
  Filled 2024-09-05: qty 30, 30d supply, fill #4
  Filled 2024-10-04: qty 30, 30d supply, fill #5

## 2024-05-06 MED ORDER — BUPROPION HCL ER (XL) 300 MG PO TB24
300.0000 mg | ORAL_TABLET | Freq: Every morning | ORAL | 12 refills | Status: AC
  Filled 2024-05-06 – 2024-07-04 (×3): qty 90, 90d supply, fill #0
  Filled 2024-09-30: qty 90, 90d supply, fill #1
  Filled 2025-01-09: qty 90, 90d supply, fill #2

## 2024-05-24 ENCOUNTER — Other Ambulatory Visit: Payer: Self-pay

## 2024-05-27 ENCOUNTER — Other Ambulatory Visit (HOSPITAL_COMMUNITY): Payer: Self-pay

## 2024-06-06 ENCOUNTER — Other Ambulatory Visit: Payer: Self-pay

## 2024-06-06 ENCOUNTER — Other Ambulatory Visit (HOSPITAL_COMMUNITY): Payer: Self-pay

## 2024-06-06 MED ORDER — ALPRAZOLAM 0.5 MG PO TABS
0.5000 mg | ORAL_TABLET | Freq: Every day | ORAL | 0 refills | Status: DC | PRN
  Filled 2024-06-06: qty 30, 30d supply, fill #0

## 2024-06-06 MED ORDER — TAMSULOSIN HCL 0.4 MG PO CAPS
0.4000 mg | ORAL_CAPSULE | Freq: Every day | ORAL | 5 refills | Status: DC
  Filled 2024-06-06: qty 30, 30d supply, fill #0
  Filled 2024-07-04: qty 30, 30d supply, fill #1
  Filled 2024-07-22 – 2024-07-29 (×2): qty 30, 30d supply, fill #2
  Filled 2024-08-30: qty 30, 30d supply, fill #3
  Filled 2024-09-30: qty 30, 30d supply, fill #4

## 2024-06-27 ENCOUNTER — Other Ambulatory Visit (HOSPITAL_COMMUNITY): Payer: Self-pay

## 2024-06-27 ENCOUNTER — Other Ambulatory Visit: Payer: Self-pay

## 2024-07-04 ENCOUNTER — Other Ambulatory Visit (HOSPITAL_COMMUNITY): Payer: Self-pay

## 2024-07-04 ENCOUNTER — Other Ambulatory Visit: Payer: Self-pay

## 2024-07-05 ENCOUNTER — Other Ambulatory Visit (HOSPITAL_COMMUNITY): Payer: Self-pay

## 2024-07-05 ENCOUNTER — Encounter (HOSPITAL_COMMUNITY): Payer: Self-pay

## 2024-07-05 MED ORDER — ALPRAZOLAM 0.5 MG PO TABS
0.5000 mg | ORAL_TABLET | Freq: Every day | ORAL | 0 refills | Status: DC | PRN
  Filled 2024-07-05: qty 30, 30d supply, fill #0

## 2024-07-19 ENCOUNTER — Other Ambulatory Visit (HOSPITAL_COMMUNITY): Payer: Self-pay

## 2024-07-22 ENCOUNTER — Other Ambulatory Visit (HOSPITAL_COMMUNITY): Payer: Self-pay

## 2024-07-23 ENCOUNTER — Other Ambulatory Visit (HOSPITAL_COMMUNITY): Payer: Self-pay

## 2024-07-23 ENCOUNTER — Other Ambulatory Visit: Payer: Self-pay

## 2024-07-23 MED ORDER — AMLODIPINE BESYLATE 5 MG PO TABS
5.0000 mg | ORAL_TABLET | Freq: Every day | ORAL | 1 refills | Status: AC
  Filled 2024-07-23: qty 90, 90d supply, fill #0
  Filled 2024-10-21: qty 90, 90d supply, fill #1

## 2024-07-23 MED ORDER — FUROSEMIDE 20 MG PO TABS
20.0000 mg | ORAL_TABLET | Freq: Every day | ORAL | 1 refills | Status: AC
  Filled 2024-07-23: qty 90, 90d supply, fill #0
  Filled 2024-10-21: qty 90, 90d supply, fill #1

## 2024-07-24 ENCOUNTER — Other Ambulatory Visit (HOSPITAL_COMMUNITY): Payer: Self-pay

## 2024-07-26 ENCOUNTER — Other Ambulatory Visit (HOSPITAL_COMMUNITY): Payer: Self-pay

## 2024-07-26 MED ORDER — PHENTERMINE HCL 37.5 MG PO TABS
37.5000 mg | ORAL_TABLET | Freq: Every day | ORAL | 2 refills | Status: DC
  Filled 2024-07-26: qty 30, 30d supply, fill #0
  Filled 2024-08-30: qty 30, 30d supply, fill #1
  Filled 2024-09-27: qty 30, 30d supply, fill #2

## 2024-07-29 ENCOUNTER — Other Ambulatory Visit (HOSPITAL_COMMUNITY): Payer: Self-pay

## 2024-08-02 ENCOUNTER — Other Ambulatory Visit (HOSPITAL_COMMUNITY): Payer: Self-pay

## 2024-08-02 ENCOUNTER — Other Ambulatory Visit: Payer: Self-pay

## 2024-08-06 ENCOUNTER — Encounter (HOSPITAL_COMMUNITY): Payer: Self-pay

## 2024-08-06 ENCOUNTER — Other Ambulatory Visit (HOSPITAL_COMMUNITY): Payer: Self-pay

## 2024-08-06 MED ORDER — ALPRAZOLAM 0.5 MG PO TABS
0.5000 mg | ORAL_TABLET | Freq: Every day | ORAL | 0 refills | Status: AC | PRN
  Filled 2024-08-06: qty 30, 30d supply, fill #0

## 2024-08-07 ENCOUNTER — Other Ambulatory Visit (HOSPITAL_COMMUNITY): Payer: Self-pay

## 2024-08-07 MED ORDER — MELOXICAM 15 MG PO TABS
15.0000 mg | ORAL_TABLET | Freq: Every day | ORAL | 1 refills | Status: AC | PRN
  Filled 2024-08-07: qty 30, 30d supply, fill #0

## 2024-08-07 MED ORDER — ALPRAZOLAM 0.5 MG PO TABS
0.5000 mg | ORAL_TABLET | Freq: Every day | ORAL | 0 refills | Status: DC | PRN
  Filled 2024-08-07 – 2024-09-05 (×2): qty 30, 30d supply, fill #0

## 2024-08-30 ENCOUNTER — Other Ambulatory Visit: Payer: Self-pay

## 2024-09-05 ENCOUNTER — Other Ambulatory Visit: Payer: Self-pay

## 2024-09-05 ENCOUNTER — Other Ambulatory Visit (HOSPITAL_COMMUNITY): Payer: Self-pay

## 2024-09-27 ENCOUNTER — Other Ambulatory Visit: Payer: Self-pay

## 2024-10-04 ENCOUNTER — Other Ambulatory Visit (HOSPITAL_COMMUNITY): Payer: Self-pay

## 2024-10-04 ENCOUNTER — Other Ambulatory Visit: Payer: Self-pay

## 2024-10-07 ENCOUNTER — Encounter (HOSPITAL_COMMUNITY): Payer: Self-pay

## 2024-10-07 ENCOUNTER — Other Ambulatory Visit (HOSPITAL_COMMUNITY): Payer: Self-pay

## 2024-10-08 ENCOUNTER — Other Ambulatory Visit (HOSPITAL_COMMUNITY): Payer: Self-pay

## 2024-10-08 MED ORDER — ALPRAZOLAM 0.5 MG PO TABS
0.5000 mg | ORAL_TABLET | Freq: Every day | ORAL | 0 refills | Status: DC | PRN
  Filled 2024-10-08: qty 30, 30d supply, fill #0

## 2024-10-25 ENCOUNTER — Other Ambulatory Visit (HOSPITAL_COMMUNITY): Payer: Self-pay

## 2024-10-29 ENCOUNTER — Other Ambulatory Visit: Payer: Self-pay

## 2024-10-29 ENCOUNTER — Other Ambulatory Visit (HOSPITAL_COMMUNITY): Payer: Self-pay

## 2024-10-29 MED ORDER — ZEPBOUND 2.5 MG/0.5ML ~~LOC~~ SOAJ
2.5000 mg | SUBCUTANEOUS | 2 refills | Status: AC
  Filled 2024-10-29 – 2024-11-20 (×4): qty 2, 28d supply, fill #0
  Filled 2024-12-20: qty 2, 28d supply, fill #1

## 2024-10-29 MED ORDER — PHENTERMINE HCL 37.5 MG PO TABS
37.5000 mg | ORAL_TABLET | Freq: Every day | ORAL | 0 refills | Status: AC
  Filled 2024-10-29: qty 30, 30d supply, fill #0

## 2024-10-30 ENCOUNTER — Other Ambulatory Visit (HOSPITAL_COMMUNITY): Payer: Self-pay

## 2024-10-31 ENCOUNTER — Encounter (HOSPITAL_COMMUNITY): Payer: Self-pay

## 2024-11-01 ENCOUNTER — Other Ambulatory Visit (HOSPITAL_COMMUNITY): Payer: Self-pay

## 2024-11-06 ENCOUNTER — Other Ambulatory Visit (HOSPITAL_COMMUNITY): Payer: Self-pay

## 2024-11-07 ENCOUNTER — Other Ambulatory Visit: Payer: Self-pay

## 2024-11-07 ENCOUNTER — Other Ambulatory Visit (HOSPITAL_BASED_OUTPATIENT_CLINIC_OR_DEPARTMENT_OTHER): Payer: Self-pay

## 2024-11-07 ENCOUNTER — Other Ambulatory Visit (HOSPITAL_COMMUNITY): Payer: Self-pay

## 2024-11-07 MED ORDER — ALPRAZOLAM 0.5 MG PO TABS
0.5000 mg | ORAL_TABLET | Freq: Every day | ORAL | 0 refills | Status: AC
  Filled 2024-11-07: qty 30, 30d supply, fill #0

## 2024-11-07 MED ORDER — ZOLPIDEM TARTRATE 10 MG PO TABS
10.0000 mg | ORAL_TABLET | Freq: Every evening | ORAL | 5 refills | Status: AC | PRN
  Filled 2024-11-07 (×2): qty 30, 30d supply, fill #0
  Filled 2024-12-06: qty 30, 30d supply, fill #1
  Filled 2025-01-09: qty 30, 30d supply, fill #2

## 2024-11-14 ENCOUNTER — Encounter (HOSPITAL_COMMUNITY): Payer: Self-pay

## 2024-11-15 ENCOUNTER — Other Ambulatory Visit (HOSPITAL_COMMUNITY): Payer: Self-pay

## 2024-11-20 ENCOUNTER — Other Ambulatory Visit (HOSPITAL_COMMUNITY): Payer: Self-pay

## 2024-11-20 ENCOUNTER — Other Ambulatory Visit: Payer: Self-pay

## 2024-11-20 ENCOUNTER — Encounter (HOSPITAL_COMMUNITY): Payer: Self-pay

## 2024-12-06 ENCOUNTER — Other Ambulatory Visit (HOSPITAL_COMMUNITY): Payer: Self-pay

## 2024-12-06 ENCOUNTER — Other Ambulatory Visit: Payer: Self-pay

## 2024-12-09 ENCOUNTER — Encounter (HOSPITAL_COMMUNITY): Payer: Self-pay

## 2024-12-09 ENCOUNTER — Other Ambulatory Visit (HOSPITAL_COMMUNITY): Payer: Self-pay

## 2024-12-11 ENCOUNTER — Other Ambulatory Visit (HOSPITAL_COMMUNITY): Payer: Self-pay

## 2024-12-11 ENCOUNTER — Other Ambulatory Visit: Payer: Self-pay

## 2024-12-11 MED ORDER — ALPRAZOLAM 0.5 MG PO TABS
0.5000 mg | ORAL_TABLET | Freq: Every day | ORAL | 0 refills | Status: DC | PRN
  Filled 2024-12-11 – 2024-12-12 (×2): qty 30, 30d supply, fill #0

## 2024-12-12 ENCOUNTER — Other Ambulatory Visit (HOSPITAL_COMMUNITY): Payer: Self-pay

## 2025-01-09 ENCOUNTER — Other Ambulatory Visit: Payer: Self-pay

## 2025-01-09 ENCOUNTER — Other Ambulatory Visit (HOSPITAL_COMMUNITY): Payer: Self-pay

## 2025-01-09 MED ORDER — ALPRAZOLAM 0.5 MG PO TABS
0.5000 mg | ORAL_TABLET | Freq: Every day | ORAL | 0 refills | Status: AC
  Filled 2025-01-09: qty 30, 30d supply, fill #0
